# Patient Record
Sex: Male | Born: 1980 | Race: White | Hispanic: No | Marital: Single | Smoking: Current some day smoker
Health system: Southern US, Community
[De-identification: ages and names within clinical notes are randomized; demographics above are authoritative.]

## PROBLEM LIST (undated history)

## (undated) DIAGNOSIS — F32A Depression, unspecified: Secondary | ICD-10-CM

## (undated) DIAGNOSIS — F329 Major depressive disorder, single episode, unspecified: Secondary | ICD-10-CM

## (undated) DIAGNOSIS — K219 Gastro-esophageal reflux disease without esophagitis: Secondary | ICD-10-CM

---
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
## 2012-05-05 ENCOUNTER — Encounter (HOSPITAL_COMMUNITY): Payer: Self-pay

## 2012-05-05 ENCOUNTER — Emergency Department (HOSPITAL_COMMUNITY)
Admission: EM | Admit: 2012-05-05 | Discharge: 2012-05-05 | Disposition: A | Discharge status: Home / Self Care | Payer: BC Managed Care – PPO | Attending: Emergency Medicine | Admitting: Emergency Medicine

## 2012-05-05 DIAGNOSIS — R000 Tachycardia, unspecified: Secondary | ICD-10-CM | POA: Insufficient documentation

## 2012-05-05 DIAGNOSIS — L02519 Cutaneous abscess of unspecified hand: Secondary | ICD-10-CM | POA: Insufficient documentation

## 2012-05-05 DIAGNOSIS — L03114 Cellulitis of left upper limb: Secondary | ICD-10-CM

## 2012-05-05 DIAGNOSIS — L02219 Cutaneous abscess of trunk, unspecified: Secondary | ICD-10-CM | POA: Insufficient documentation

## 2012-05-05 DIAGNOSIS — L03119 Cellulitis of unspecified part of limb: Secondary | ICD-10-CM | POA: Insufficient documentation

## 2012-05-05 LAB — CBC WITH DIFFERENTIAL/PLATELET
Basophils Absolute: 0 10*3/uL (ref 0.0–0.1)
Basophils Relative: 0 % (ref 0–1)
Eosinophils Absolute: 0.1 10*3/uL (ref 0.0–0.7)
Eosinophils Relative: 1 % (ref 0–5)
HCT: 46.7 % (ref 39.0–52.0)
Hemoglobin: 16.8 g/dL (ref 13.0–17.0)
Lymphocytes Relative: 10 % — ABNORMAL LOW (ref 12–46)
Lymphs Abs: 1.1 10*3/uL (ref 0.7–4.0)
MCH: 31.2 pg (ref 26.0–34.0)
MCHC: 36 g/dL (ref 30.0–36.0)
MCV: 86.8 fL (ref 78.0–100.0)
Monocytes Absolute: 0.8 10*3/uL (ref 0.1–1.0)
Monocytes Relative: 7 % (ref 3–12)
Neutro Abs: 9.9 10*3/uL — ABNORMAL HIGH (ref 1.7–7.7)
Neutrophils Relative %: 83 % — ABNORMAL HIGH (ref 43–77)
Platelets: 142 10*3/uL — ABNORMAL LOW (ref 150–400)
RBC: 5.38 MIL/uL (ref 4.22–5.81)
RDW: 12.2 % (ref 11.5–15.5)
WBC: 11.9 10*3/uL — ABNORMAL HIGH (ref 4.0–10.5)

## 2012-05-05 MED ORDER — ONDANSETRON HCL 4 MG PO TABS
4.0000 mg | ORAL_TABLET | Freq: Once | ORAL | Status: AC
  Administered 2012-05-05: 4 mg via ORAL
  Filled 2012-05-05: qty 1

## 2012-05-05 MED ORDER — DOXYCYCLINE HYCLATE 100 MG PO CAPS: 100.0000 mg | ORAL_CAPSULE | Freq: Two times a day (BID) | ORAL | Status: DC

## 2012-05-05 MED ORDER — HYDROCODONE-ACETAMINOPHEN 5-325 MG PO TABS: ORAL_TABLET | ORAL | Status: DC

## 2012-05-05 MED ORDER — HYDROMORPHONE HCL PF 1 MG/ML IJ SOLN
1.0000 mg | Freq: Once | INTRAMUSCULAR | Status: AC
  Administered 2012-05-05: 1 mg via INTRAVENOUS
  Filled 2012-05-05: qty 1

## 2012-05-05 MED ORDER — VANCOMYCIN HCL IN DEXTROSE 1-5 GM/200ML-% IV SOLN
1000.0000 mg | Freq: Once | INTRAVENOUS | Status: AC
  Administered 2012-05-05: 1000 mg via INTRAVENOUS
  Filled 2012-05-05: qty 200

## 2012-05-05 MED ORDER — AMOXICILLIN 500 MG PO CAPS: 500.0000 mg | ORAL_CAPSULE | Freq: Three times a day (TID) | ORAL | Status: DC

## 2012-05-05 NOTE — ED Notes (Signed)
Pt reports has abscess to left hand, left and r groin x 2 days.  Abscesses to hand and left groin are red and swollen.  Small bump noted to r groin.  Unsure if has had fever.

## 2012-05-05 NOTE — ED Provider Notes (Signed)
History     CSN: [id number]  Arrival date & time 05/05/12  1717   First MD Initiated Contact with Patient 05/05/12 1817      Chief Complaint  Patient presents with  . Abscess    (Consider location/radiation/quality/duration/timing/severity/associated sxs/prior treatment) HPI Comments: Patient is a 32 year old male who presents to the emergency department with an abscess of the left hand, and the left lower abdomen area.  The patient states that he first noted a pimple on his left lower abdomen area, he attempted to squeeze it and it then became worse. 2 days ago he had a pimple that he thought was a bite on his left hand he also attempted to squeeze this and today he noted redness and swelling from the dorsum of the hand to the mid forearm. He states that he has pain and feels tightness when he attempts to make a fist. He also has a throbbing pain in the area at times. He said very little drainage from the bite area on the hand. He's not had any fever or chills. No nausea or vomiting reported. He does complain of increasing pain of the left hand. Patient has not taken any medications for these problems.  Patient is a 32 y.o. male presenting with abscess. The history is provided by the patient and the spouse.  Abscess   History reviewed. No pertinent past medical history.  History reviewed. No pertinent past surgical history.  No family history on file.  History  Substance Use Topics  . Smoking status: Never Smoker   . Smokeless tobacco: Not on file  . Alcohol Use: Yes     Comment: occ      Review of Systems  Constitutional: Negative for activity change.       All ROS Neg except as noted in HPI  HENT: Negative for nosebleeds and neck pain.   Eyes: Negative for photophobia and discharge.  Respiratory: Negative for cough, shortness of breath and wheezing.   Cardiovascular: Negative for chest pain and palpitations.  Gastrointestinal: Negative for abdominal pain and blood in  stool.  Genitourinary: Negative for dysuria, frequency and hematuria.  Musculoskeletal: Negative for back pain and arthralgias.  Skin: Positive for wound.  Neurological: Negative for dizziness, seizures and speech difficulty.  Psychiatric/Behavioral: Negative for hallucinations and confusion.    Allergies  Review of patient's allergies indicates no known allergies.  Home Medications  No current outpatient prescriptions on file.  BP 119/65  Pulse 98  Temp(Src) 98.1 F (36.7 C) (Oral)  Resp 20  Ht 5\' 10"  (1.778 m)  Wt 183 lb (83.008 kg)  BMI 26.26 kg/m2  SpO2 97%  Physical Exam  Nursing note and vitals reviewed. Constitutional: He is oriented to person, place, and time. He appears well-developed and well-nourished.  Non-toxic appearance.  HENT:  Head: Normocephalic.  Right Ear: Tympanic membrane and external ear normal.  Left Ear: Tympanic membrane and external ear normal.  Eyes: EOM and lids are normal. Pupils are equal, round, and reactive to light.  Neck: Normal range of motion. Neck supple. Carotid bruit is not present.  Cardiovascular: Regular rhythm, normal heart sounds, intact distal pulses and normal pulses.  Tachycardia present.   Pulmonary/Chest: Breath sounds normal. No respiratory distress.  Abdominal: Soft. Bowel sounds are normal. There is no tenderness. There is no guarding.  Patient has a draining abscess of the left lower abdomen. It is tender to touch. There is a small area of increased redness around the abscess area. There is  no red streaking appreciated. The area is warm but not hot.  Musculoskeletal: Normal range of motion.  There is a denuded blister of the dorsum of the left thumb. There is increased redness and warmth from the metacarpal phalangeal joint areas to the mid forearm. The area is sore and in some areas painful to touch. There is fair range of motion of the fingers due to pain. The capillary refill is less than 3 seconds. I cannot demonstrate  swollen nodes in the biceps triceps area or the axilla. The biceps triceps area is sore to touch.  Lymphadenopathy:       Head (right side): No submandibular adenopathy present.       Head (left side): No submandibular adenopathy present.    He has no cervical adenopathy.  Neurological: He is alert and oriented to person, place, and time. He has normal strength. No cranial nerve deficit or sensory deficit.  Skin: Skin is warm and dry.  Psychiatric: He has a normal mood and affect. His speech is normal.    ED Course  Procedures (including critical care time)  Labs Reviewed  CBC WITH DIFFERENTIAL - Abnormal; Notable for the following:    WBC 11.9 (*)    Platelets 142 (*)    Neutrophils Relative 83 (*)    Neutro Abs 9.9 (*)    Lymphocytes Relative 10 (*)    All other components within normal limits  CULTURE, ROUTINE-ABSCESS   No results found.   No diagnosis found.    MDM  I have reviewed nursing notes, vital signs, and all appropriate lab and imaging results for this patient. Patient had an abscess of the left lower abdomen that he attempted to rupture it. This got worse. He didn't notice what he thought was a bite on the left hand he attempted to rupture this and this 2.worse. The patient presents to the emergency department with an abscess of the left lower abdomen, and cellulitis of the left hand.  The patient was treated in the emergency department with intravenous vancomycin. A complete blood count was obtained which revealed a slightly elevated white blood cell count of 11,900. There was no documented shift to the left.  The patient tolerated the infusion of the vancomycin without problem. The patient was also given pain medication which helped him with his discomfort.  Plan the patient will return to the emergency department on Wednesday, April 9. He will be placed on doxycycline 2 times daily, Mobic 2 times daily, and Norco every 4 hours as needed for pain. Patient will  use warm tub soaks for the abscess on his abdomen. A culture of the drainage from the abscess on the abdomen has been sent to the lab. Patient will return solar if any changes, problems, or concerns.       Kathie Dike, PA-C 05/05/12 2126

## 2012-05-05 NOTE — ED Provider Notes (Signed)
Medical screening examination/treatment/procedure(s) were performed by non-physician practitioner and as supervising physician I was immediately available for consultation/collaboration.   Jaryd Drew L Yamil Dougher, MD 05/05/12 2315 

## 2012-05-08 ENCOUNTER — Emergency Department (HOSPITAL_COMMUNITY): Payer: BC Managed Care – PPO

## 2012-05-08 ENCOUNTER — Encounter (HOSPITAL_COMMUNITY): Payer: Self-pay

## 2012-05-08 ENCOUNTER — Emergency Department (HOSPITAL_COMMUNITY)
Admission: EM | Admit: 2012-05-08 | Discharge: 2012-05-08 | Disposition: A | Discharge status: Home / Self Care | Payer: BC Managed Care – PPO | Attending: Emergency Medicine | Admitting: Emergency Medicine

## 2012-05-08 DIAGNOSIS — X58XXXA Exposure to other specified factors, initial encounter: Secondary | ICD-10-CM | POA: Insufficient documentation

## 2012-05-08 DIAGNOSIS — R198 Other specified symptoms and signs involving the digestive system and abdomen: Secondary | ICD-10-CM | POA: Insufficient documentation

## 2012-05-08 DIAGNOSIS — Y939 Activity, unspecified: Secondary | ICD-10-CM | POA: Insufficient documentation

## 2012-05-08 DIAGNOSIS — L089 Local infection of the skin and subcutaneous tissue, unspecified: Secondary | ICD-10-CM

## 2012-05-08 DIAGNOSIS — IMO0002 Reserved for concepts with insufficient information to code with codable children: Secondary | ICD-10-CM | POA: Insufficient documentation

## 2012-05-08 DIAGNOSIS — Y929 Unspecified place or not applicable: Secondary | ICD-10-CM | POA: Insufficient documentation

## 2012-05-08 NOTE — ED Notes (Signed)
Pt returns to ED for wound check from initial check on Sunday. Pt's left hand remains swollen, however the original cellulitis in said hand has decreased significantly.  Large open abscess noted to mid thumb on left hand, small amount drainage noted. Drainage is yellow and thick at this time. Pt denies fever, chills and n/v. NAD noted.

## 2012-05-08 NOTE — ED Provider Notes (Signed)
History    This chart was scribed for [name], MD by Marlyne Beards, ED Scribe. The patient was seen in room [facility]. Patient's care was started at 3:55 PM.    CSN: [id number]  Arrival date & time 05/08/12  1500   First MD Initiated Contact with Patient 05/08/12 1555      Chief Complaint  Patient presents with  . Wound Check    (Consider location/radiation/quality/duration/timing/severity/associated sxs/prior treatment) Patient is a 32 y.o. male presenting with wound check. The history is provided by the patient. No language interpreter was used.  Wound Check This is a new problem. The current episode started more than 2 days ago. The problem occurs constantly. The problem has been gradually worsening. Pertinent negatives include no chest pain, no abdominal pain, no headaches and no shortness of breath. The symptoms are aggravated by bending.   Steve Frazier is a 32 y.o. male who presents to the Emergency Department complaining of moderate wound infection onset last Friday. Pt states that he thought he had an ingrown hair on his hand so he squeezed it to try and get the hair out resulting in the site to swell up and have purple discoloration. Pt was seen in the ED Sunday and given antibiotics but states the wound has gotten worse today.  Pt also has an irritated area on his lower left abdomen as well. Pt denies trouble breathing/swallowing, fever, chills, cough, nausea, vomiting, diarrhea, SOB, weakness, and any other associated symptoms. Pt admits to alcohol use.    History reviewed. No pertinent past medical history.  History reviewed. No pertinent past surgical history.  No family history on file.  History  Substance Use Topics  . Smoking status: Never Smoker   . Smokeless tobacco: Not on file  . Alcohol Use: Yes     Comment: occ      Review of Systems  Respiratory: Negative for shortness of breath.   Cardiovascular: Negative for chest pain.  Gastrointestinal:  Negative for abdominal pain.  Neurological: Negative for headaches.    Allergies  Review of patient's allergies indicates no known allergies.  Home Medications   Current Outpatient Rx  Name  Route  Sig  Dispense  Refill  . amoxicillin (AMOXIL) 500 MG capsule   Oral   Take 1 capsule (500 mg total) by mouth 3 (three) times daily.   21 capsule   0   . doxycycline (VIBRAMYCIN) 100 MG capsule   Oral   Take 1 capsule (100 mg total) by mouth 2 (two) times daily.   20 capsule   0   . HYDROcodone-acetaminophen (NORCO/VICODIN) 5-325 MG per tablet      1 or 2 po q4h prn pain   20 tablet   0     BP 134/96  Pulse 119  Temp(Src) 97.7 F (36.5 C) (Oral)  Resp 18  Ht 5\' 10"  (1.778 m)  Wt 183 lb (83.008 kg)  BMI 26.26 kg/m2  SpO2 99%  Physical Exam  Nursing note and vitals reviewed. Constitutional: He is oriented to person, place, and time. He appears well-developed.  HENT:  Head: Normocephalic.  Eyes: Conjunctivae are normal.  Neck: No tracheal deviation present.  Cardiovascular:  No murmur heard. Musculoskeletal: Normal range of motion.  Neurological: He is oriented to person, place, and time.  Skin: Skin is warm.  Pt has swelling, redness, and cellulitis to the left hand, 1st MCP joint. Mild irritation to the LLQ of abdomen.  Psychiatric: He has a normal mood  and affect.    ED Course  Procedures (including critical care time) DIAGNOSTIC STUDIES: Oxygen Saturation is 99% on room air, normal by my interpretation.    COORDINATION OF CARE: 4:06 PM Discussed ED treatment with pt and pt agrees.  5:20 PM Discussed with pt about consulting with a hand doctor for symptoms. 5:46 PM Discussed with pt that Dr. Orlan Leavens will be able to see pt in his office tomorrow.  Labs Reviewed - No data to display Dg Hand Complete Left  05/08/2012  *RADIOLOGY REPORT*  Clinical Data: Redness, swelling, pain  LEFT HAND - COMPLETE 3+ VIEW  Comparison: None.  Findings: Three views of the left  hand submitted.  No acute fracture or subluxation.  There is soft tissue swelling in the region of the first metacarpal.  No periosteal reaction or bony erosion.  IMPRESSION: No acute fracture or subluxation.  Soft tissue swelling in the region of first metacarpal.   Original Report Authenticated By: Natasha Mead, M.D.      No diagnosis found.    MDM  The chart was scribed for me under my direct supervision.  I personally performed the history, physical, and medical decision making and all procedures in the evaluation of this patient.[name], MD 05/08/12 [phone]

## 2012-05-08 NOTE — ED Notes (Signed)
Pt states he was here Sunday for infected wound and received antibiotics. States wound is worse today

## 2012-05-09 ENCOUNTER — Encounter (HOSPITAL_COMMUNITY): Payer: Self-pay | Admitting: *Deleted

## 2012-05-09 ENCOUNTER — Encounter (HOSPITAL_COMMUNITY)
Admission: RE | Disposition: A | Discharge status: Home / Self Care | Payer: Self-pay | Source: Ambulatory Visit | Attending: Orthopedic Surgery

## 2012-05-09 ENCOUNTER — Inpatient Hospital Stay (HOSPITAL_COMMUNITY): Payer: BC Managed Care – PPO | Admitting: *Deleted

## 2012-05-09 ENCOUNTER — Observation Stay (HOSPITAL_COMMUNITY)
Admission: RE | Admit: 2012-05-09 | Discharge: 2012-05-11 | Disposition: A | Discharge status: Home / Self Care | Payer: BC Managed Care – PPO | Source: Ambulatory Visit | Attending: Orthopedic Surgery | Admitting: Orthopedic Surgery

## 2012-05-09 DIAGNOSIS — M65849 Other synovitis and tenosynovitis, unspecified hand: Secondary | ICD-10-CM | POA: Insufficient documentation

## 2012-05-09 DIAGNOSIS — L02519 Cutaneous abscess of unspecified hand: Principal | ICD-10-CM | POA: Insufficient documentation

## 2012-05-09 DIAGNOSIS — L03019 Cellulitis of unspecified finger: Principal | ICD-10-CM | POA: Insufficient documentation

## 2012-05-09 DIAGNOSIS — M65839 Other synovitis and tenosynovitis, unspecified forearm: Secondary | ICD-10-CM | POA: Insufficient documentation

## 2012-05-09 HISTORY — DX: Major depressive disorder, single episode, unspecified: F32.9

## 2012-05-09 HISTORY — PX: I&D EXTREMITY: SHX5045

## 2012-05-09 HISTORY — DX: Gastro-esophageal reflux disease without esophagitis: K21.9

## 2012-05-09 HISTORY — DX: Depression, unspecified: F32.A

## 2012-05-09 HISTORY — PX: TENOSYNOVECTOMY: SHX6110

## 2012-05-09 LAB — CULTURE, ROUTINE-ABSCESS

## 2012-05-09 LAB — SURGICAL PCR SCREEN: MRSA, PCR: NEGATIVE

## 2012-05-09 SURGERY — IRRIGATION AND DEBRIDEMENT EXTREMITY
Anesthesia: General | Laterality: Left | Wound class: Dirty or Infected

## 2012-05-09 MED ORDER — MUPIROCIN 2 % EX OINT
TOPICAL_OINTMENT | CUTANEOUS | Status: AC
  Administered 2012-05-09: 1 via NASAL
  Filled 2012-05-09: qty 22

## 2012-05-09 MED ORDER — VITAMIN C 500 MG PO TABS
1000.0000 mg | ORAL_TABLET | Freq: Every day | ORAL | Status: DC
  Administered 2012-05-09 – 2012-05-11 (×3): 1000 mg via ORAL
  Filled 2012-05-09 (×3): qty 2

## 2012-05-09 MED ORDER — MIDAZOLAM HCL 2 MG/2ML IJ SOLN: 0.5000 mg | Freq: Once | INTRAMUSCULAR | Status: DC | PRN

## 2012-05-09 MED ORDER — HYDROMORPHONE HCL PF 1 MG/ML IJ SOLN
0.5000 mg | INTRAMUSCULAR | Status: DC | PRN
  Administered 2012-05-10: 1 mg via INTRAVENOUS
  Filled 2012-05-09: qty 1

## 2012-05-09 MED ORDER — ZOLPIDEM TARTRATE 5 MG PO TABS: 5.0000 mg | ORAL_TABLET | Freq: Every evening | ORAL | Status: DC | PRN

## 2012-05-09 MED ORDER — DOCUSATE SODIUM 100 MG PO CAPS: 100.0000 mg | ORAL_CAPSULE | Freq: Two times a day (BID) | ORAL | Status: DC

## 2012-05-09 MED ORDER — DEXTROSE 5 % IV SOLN: 500.0000 mg | Freq: Four times a day (QID) | INTRAVENOUS | Status: DC | PRN

## 2012-05-09 MED ORDER — ONDANSETRON HCL 4 MG/2ML IJ SOLN: 4.0000 mg | Freq: Four times a day (QID) | INTRAMUSCULAR | Status: DC | PRN

## 2012-05-09 MED ORDER — PROMETHAZINE HCL 25 MG/ML IJ SOLN: 6.2500 mg | INTRAMUSCULAR | Status: DC | PRN

## 2012-05-09 MED ORDER — OXYCODONE HCL 5 MG/5ML PO SOLN: 5.0000 mg | Freq: Once | ORAL | Status: DC | PRN

## 2012-05-09 MED ORDER — HYDROCODONE-ACETAMINOPHEN 5-325 MG PO TABS: 1.0000 | ORAL_TABLET | ORAL | Status: DC | PRN

## 2012-05-09 MED ORDER — HYDROMORPHONE HCL PF 1 MG/ML IJ SOLN
INTRAMUSCULAR | Status: AC
  Filled 2012-05-09: qty 1

## 2012-05-09 MED ORDER — ADULT MULTIVITAMIN W/MINERALS CH
1.0000 | ORAL_TABLET | Freq: Every day | ORAL | Status: DC
  Administered 2012-05-09 – 2012-05-11 (×3): 1 via ORAL
  Filled 2012-05-09 (×3): qty 1

## 2012-05-09 MED ORDER — LACTATED RINGERS IV SOLN
INTRAVENOUS | Status: DC
  Administered 2012-05-09: 16:00:00 via INTRAVENOUS

## 2012-05-09 MED ORDER — FENTANYL CITRATE 0.05 MG/ML IJ SOLN
INTRAMUSCULAR | Status: DC | PRN
  Administered 2012-05-09: 100 ug via INTRAVENOUS
  Administered 2012-05-09: 50 ug via INTRAVENOUS
  Administered 2012-05-09: 100 ug via INTRAVENOUS

## 2012-05-09 MED ORDER — LIDOCAINE HCL (CARDIAC) 20 MG/ML IV SOLN
INTRAVENOUS | Status: DC | PRN
  Administered 2012-05-09: 25 mg via INTRAVENOUS

## 2012-05-09 MED ORDER — ONDANSETRON HCL 4 MG/2ML IJ SOLN
INTRAMUSCULAR | Status: DC | PRN
  Administered 2012-05-09: 4 mg via INTRAVENOUS

## 2012-05-09 MED ORDER — 0.9 % SODIUM CHLORIDE (POUR BTL) OPTIME
TOPICAL | Status: DC | PRN
  Administered 2012-05-09: 1000 mL

## 2012-05-09 MED ORDER — KCL IN DEXTROSE-NACL 20-5-0.45 MEQ/L-%-% IV SOLN
INTRAVENOUS | Status: DC
  Administered 2012-05-11: 04:00:00 via INTRAVENOUS
  Filled 2012-05-09 (×4): qty 1000

## 2012-05-09 MED ORDER — OXYCODONE-ACETAMINOPHEN 10-325 MG PO TABS: 1.0000 | ORAL_TABLET | ORAL | Status: DC | PRN

## 2012-05-09 MED ORDER — VANCOMYCIN HCL IN DEXTROSE 1-5 GM/200ML-% IV SOLN
INTRAVENOUS | Status: AC
  Administered 2012-05-09: 1000 mg via INTRAVENOUS
  Filled 2012-05-09: qty 200

## 2012-05-09 MED ORDER — SODIUM CHLORIDE 0.9 % IR SOLN
Status: DC | PRN
  Administered 2012-05-09: 3000 mL

## 2012-05-09 MED ORDER — MEPERIDINE HCL 25 MG/ML IJ SOLN: 6.2500 mg | INTRAMUSCULAR | Status: DC | PRN

## 2012-05-09 MED ORDER — OXYCODONE HCL 5 MG PO TABS: 5.0000 mg | ORAL_TABLET | Freq: Once | ORAL | Status: DC | PRN

## 2012-05-09 MED ORDER — VANCOMYCIN HCL IN DEXTROSE 1-5 GM/200ML-% IV SOLN
1000.0000 mg | Freq: Three times a day (TID) | INTRAVENOUS | Status: DC
  Administered 2012-05-09 – 2012-05-11 (×6): 1000 mg via INTRAVENOUS
  Filled 2012-05-09 (×8): qty 200

## 2012-05-09 MED ORDER — METHOCARBAMOL 500 MG PO TABS: 500.0000 mg | ORAL_TABLET | Freq: Four times a day (QID) | ORAL | Status: DC | PRN

## 2012-05-09 MED ORDER — ONDANSETRON HCL 4 MG PO TABS: 4.0000 mg | ORAL_TABLET | Freq: Four times a day (QID) | ORAL | Status: DC | PRN

## 2012-05-09 MED ORDER — PROPOFOL 10 MG/ML IV BOLUS
INTRAVENOUS | Status: DC | PRN
  Administered 2012-05-09: 300 mg via INTRAVENOUS

## 2012-05-09 MED ORDER — OXYCODONE-ACETAMINOPHEN 5-325 MG PO TABS
1.0000 | ORAL_TABLET | ORAL | Status: DC | PRN
  Administered 2012-05-09 – 2012-05-10 (×4): 2 via ORAL
  Filled 2012-05-09 (×4): qty 2

## 2012-05-09 MED ORDER — DOCUSATE SODIUM 100 MG PO CAPS
100.0000 mg | ORAL_CAPSULE | Freq: Two times a day (BID) | ORAL | Status: DC
  Administered 2012-05-09 – 2012-05-11 (×4): 100 mg via ORAL
  Filled 2012-05-09 (×5): qty 1

## 2012-05-09 MED ORDER — DIPHENHYDRAMINE HCL 25 MG PO CAPS: 25.0000 mg | ORAL_CAPSULE | Freq: Four times a day (QID) | ORAL | Status: DC | PRN

## 2012-05-09 MED ORDER — LACTATED RINGERS IV SOLN
INTRAVENOUS | Status: DC | PRN
  Administered 2012-05-09: 16:00:00 via INTRAVENOUS

## 2012-05-09 MED ORDER — HYDROMORPHONE HCL PF 1 MG/ML IJ SOLN
0.2500 mg | INTRAMUSCULAR | Status: DC | PRN
  Administered 2012-05-09 (×4): 0.5 mg via INTRAVENOUS

## 2012-05-09 MED ORDER — MIDAZOLAM HCL 5 MG/5ML IJ SOLN
INTRAMUSCULAR | Status: DC | PRN
  Administered 2012-05-09: 2 mg via INTRAVENOUS

## 2012-05-09 SURGICAL SUPPLY — 50 items
BANDAGE CONFORM 2  STR LF (GAUZE/BANDAGES/DRESSINGS) IMPLANT
BANDAGE ELASTIC 3 VELCRO ST LF (GAUZE/BANDAGES/DRESSINGS) ×2 IMPLANT
BANDAGE ELASTIC 4 VELCRO ST LF (GAUZE/BANDAGES/DRESSINGS) ×2 IMPLANT
BANDAGE GAUZE ELAST BULKY 4 IN (GAUZE/BANDAGES/DRESSINGS) ×2 IMPLANT
CLOTH BEACON ORANGE TIMEOUT ST (SAFETY) ×2 IMPLANT
CORDS BIPOLAR (ELECTRODE) ×2 IMPLANT
COVER SURGICAL LIGHT HANDLE (MISCELLANEOUS) ×2 IMPLANT
CUFF TOURNIQUET SINGLE 18IN (TOURNIQUET CUFF) ×2 IMPLANT
CUFF TOURNIQUET SINGLE 24IN (TOURNIQUET CUFF) IMPLANT
CUFF TOURNIQUET SINGLE 34IN LL (TOURNIQUET CUFF) IMPLANT
CUFF TOURNIQUET SINGLE 44IN (TOURNIQUET CUFF) IMPLANT
DRAIN PENROSE 1/4X12 LTX STRL (WOUND CARE) ×2 IMPLANT
DRSG ADAPTIC 3X8 NADH LF (GAUZE/BANDAGES/DRESSINGS) ×2 IMPLANT
DRSG EMULSION OIL 3X3 NADH (GAUZE/BANDAGES/DRESSINGS) ×2 IMPLANT
ELECT REM PT RETURN 9FT ADLT (ELECTROSURGICAL)
ELECTRODE REM PT RTRN 9FT ADLT (ELECTROSURGICAL) IMPLANT
GAUZE XEROFORM 1X8 LF (GAUZE/BANDAGES/DRESSINGS) IMPLANT
GLOVE BIOGEL M STRL SZ7.5 (GLOVE) ×2 IMPLANT
GLOVE SS BIOGEL STRL SZ 8 (GLOVE) ×1 IMPLANT
GLOVE SUPERSENSE BIOGEL SZ 8 (GLOVE) ×1
GOWN PREVENTION PLUS XLARGE (GOWN DISPOSABLE) ×2 IMPLANT
GOWN STRL NON-REIN LRG LVL3 (GOWN DISPOSABLE) ×6 IMPLANT
GOWN STRL REIN XL XLG (GOWN DISPOSABLE) ×4 IMPLANT
HANDPIECE INTERPULSE COAX TIP (DISPOSABLE)
IV NS IRRIG 3000ML ARTHROMATIC (IV SOLUTION) ×2 IMPLANT
KIT BASIN OR (CUSTOM PROCEDURE TRAY) ×2 IMPLANT
KIT ROOM TURNOVER OR (KITS) ×2 IMPLANT
MANIFOLD NEPTUNE II (INSTRUMENTS) ×2 IMPLANT
NEEDLE HYPO 25GX1X1/2 BEV (NEEDLE) IMPLANT
NS IRRIG 1000ML POUR BTL (IV SOLUTION) ×2 IMPLANT
PACK ORTHO EXTREMITY (CUSTOM PROCEDURE TRAY) ×2 IMPLANT
PAD ARMBOARD 7.5X6 YLW CONV (MISCELLANEOUS) ×4 IMPLANT
PAD CAST 4YDX4 CTTN HI CHSV (CAST SUPPLIES) ×1 IMPLANT
PADDING CAST COTTON 4X4 STRL (CAST SUPPLIES) ×1
PADDING CAST SYNTHETIC 4 (CAST SUPPLIES) ×1
PADDING CAST SYNTHETIC 4X4 STR (CAST SUPPLIES) ×1 IMPLANT
SET HNDPC FAN SPRY TIP SCT (DISPOSABLE) IMPLANT
SPLINT FIBERGLASS 3X35 (CAST SUPPLIES) ×2 IMPLANT
SPONGE GAUZE 4X4 12PLY (GAUZE/BANDAGES/DRESSINGS) ×2 IMPLANT
SPONGE LAP 18X18 X RAY DECT (DISPOSABLE) ×2 IMPLANT
SPONGE LAP 4X18 X RAY DECT (DISPOSABLE) ×2 IMPLANT
SUT ETHILON 4 0 PS 2 18 (SUTURE) ×2 IMPLANT
SWAB CULTURE LIQUID MINI MALE (MISCELLANEOUS) ×2 IMPLANT
SYR CONTROL 10ML LL (SYRINGE) IMPLANT
TOWEL OR 17X24 6PK STRL BLUE (TOWEL DISPOSABLE) ×2 IMPLANT
TOWEL OR 17X26 10 PK STRL BLUE (TOWEL DISPOSABLE) ×2 IMPLANT
TUBE ANAEROBIC SPECIMEN COL (MISCELLANEOUS) ×2 IMPLANT
TUBE CONNECTING 12X1/4 (SUCTIONS) ×2 IMPLANT
WATER STERILE IRR 1000ML POUR (IV SOLUTION) ×2 IMPLANT
YANKAUER SUCT BULB TIP NO VENT (SUCTIONS) IMPLANT

## 2012-05-09 NOTE — Anesthesia Procedure Notes (Signed)
Procedure Name: LMA Insertion Date/Time: 05/09/2012 3:57 PM Performed by: Margaree Mackintosh Pre-anesthesia Checklist: Patient identified, Emergency Drugs available, Suction available and Patient being monitored Patient Re-evaluated:Patient Re-evaluated prior to inductionOxygen Delivery Method: Circle system utilized Preoxygenation: Pre-oxygenation with 100% oxygen Intubation Type: IV induction LMA: LMA inserted LMA Size: 5.0 Number of attempts: 1 Placement Confirmation: ETT inserted through vocal cords under direct vision,  positive ETCO2 and breath sounds checked- equal and bilateral Tube secured with: Tape Dental Injury: Teeth and Oropharynx as per pre-operative assessment

## 2012-05-09 NOTE — Transfer of Care (Signed)
Immediate Anesthesia Transfer of Care Note  Patient: Steve Frazier  Procedure(s) Performed: Procedure(s): INCISION & DRAINAGE, DEBRIDEMENT THUMB/HAND (Left)  Patient Location: PACU  Anesthesia Type:General  Level of Consciousness: awake, alert  and oriented  Airway & Oxygen Therapy: Patient Spontanous Breathing and Patient connected to nasal cannula oxygen  Post-op Assessment: Report given to PACU RN and Post -op Vital signs reviewed and stable  Post vital signs: Reviewed and stable  Complications: No apparent anesthesia complications

## 2012-05-09 NOTE — Anesthesia Postprocedure Evaluation (Signed)
  Anesthesia Post-op Note  Patient: Steve Frazier  Procedure(s) Performed: Procedure(s): INCISION & DRAINAGE, DEBRIDEMENT THUMB/HAND (Left)  Patient Location: PACU  Anesthesia Type:General  Level of Consciousness: awake  Airway and Oxygen Therapy: Patient Spontanous Breathing  Post-op Pain: mild  Post-op Assessment: Post-op Vital signs reviewed  Post-op Vital Signs: Reviewed  Complications: No apparent anesthesia complications

## 2012-05-09 NOTE — Anesthesia Preprocedure Evaluation (Signed)
Anesthesia Evaluation  Patient identified by MRN, date of birth, ID band Patient awake    Reviewed: Allergy & Precautions, H&P , NPO status , Patient's Chart, lab work & pertinent test results  History of Anesthesia Complications Negative for: history of anesthetic complications  Airway Mallampati: I TM Distance: >3 FB Neck ROM: Full    Dental  (+) Poor Dentition and Dental Advisory Given   Pulmonary neg pulmonary ROS,  breath sounds clear to auscultation  Pulmonary exam normal       Cardiovascular negative cardio ROS  Rhythm:Regular Rate:Normal     Neuro/Psych negative neurological ROS  negative psych ROS   GI/Hepatic negative GI ROS, Neg liver ROS,   Endo/Other  negative endocrine ROS  Renal/GU negative Renal ROS     Musculoskeletal   Abdominal   Peds  Hematology   Anesthesia Other Findings   Reproductive/Obstetrics                           Anesthesia Physical Anesthesia Plan  ASA: I  Anesthesia Plan: General   Post-op Pain Management:    Induction: Intravenous  Airway Management Planned: LMA  Additional Equipment:   Intra-op Plan:   Post-operative Plan:   Informed Consent: I have reviewed the patients History and Physical, chart, labs and discussed the procedure including the risks, benefits and alternatives for the proposed anesthesia with the patient or authorized representative who has indicated his/her understanding and acceptance.   Dental advisory given  Plan Discussed with: CRNA and Surgeon  Anesthesia Plan Comments: (Plan routine monitors, GA- LMA OK)        Anesthesia Quick Evaluation

## 2012-05-09 NOTE — Progress Notes (Signed)
ANTIBIOTIC CONSULT NOTE - INITIAL  Pharmacy Consult for vancomycin Indication: hand cellulitis s/p I&D on 4/10.  Also has abdominal wound with MSSA.  No Known Allergies  Patient Measurements:   Adjusted Body Weight: 83 kg  Vital Signs: Temp: 98.2 F (36.8 C) (04/10 1854) Temp src: Oral (04/10 1340) BP: 139/67 mmHg (04/10 1854) Pulse Rate: 72 (04/10 1854) Intake/Output from previous day:   Intake/Output from this shift:    Labs: No results found for this basename: WBC, HGB, PLT, LABCREA, CREATININE,  in the last 72 hours CrCl is unknown because no creatinine reading has been taken. No results found for this basename: VANCOTROUGH, VANCOPEAK, VANCORANDOM, GENTTROUGH, GENTPEAK, GENTRANDOM, TOBRATROUGH, TOBRAPEAK, TOBRARND, AMIKACINPEAK, AMIKACINTROU, AMIKACIN,  in the last 72 hours   Microbiology: Recent Results (from the past 720 hour(s))  CULTURE, ROUTINE-ABSCESS     Status: None   Collection Time    05/05/12  9:10 PM      Result Value Range Status   Specimen Description ABSCESS LEFT LOWER ABDOMEN   Final   Special Requests NONE   Final   Gram Stain     Final   Value: MODERATE WBC PRESENT,BOTH PMN AND MONONUCLEAR     NO SQUAMOUS EPITHELIAL CELLS SEEN     ABUNDANT GRAM POSITIVE COCCI     IN PAIRS IN CLUSTERS   Culture     Final   Value: MODERATE STAPHYLOCOCCUS AUREUS     Note: RIFAMPIN AND GENTAMICIN SHOULD NOT BE USED AS SINGLE DRUGS FOR TREATMENT OF STAPH INFECTIONS.   Report Status 05/09/2012 FINAL   Final   Organism ID, Bacteria STAPHYLOCOCCUS AUREUS   Final  SURGICAL PCR SCREEN     Status: None   Collection Time    05/09/12  1:40 PM      Result Value Range Status   MRSA, PCR NEGATIVE  NEGATIVE Final   Staphylococcus aureus NEGATIVE  NEGATIVE Final   Comment:            The Xpert SA Assay (FDA     approved for NASAL specimens     in patients over 11 years of age),     is one component of     a comprehensive surveillance     program.  Test performance has   been validated by [employer] for patients greater     than or equal to 60 year old.     It is not intended     to diagnose infection nor to     guide or monitor treatment.    Medical History: Past Medical History  Diagnosis Date  . Depression   . GERD (gastroesophageal reflux disease)     with spicy food    Medications:  Prescriptions prior to admission  Medication Sig Dispense Refill  . amoxicillin (AMOXIL) 500 MG capsule Take 1 capsule (500 mg total) by mouth 3 (three) times daily.  21 capsule  0  . doxycycline (VIBRAMYCIN) 100 MG capsule Take 1 capsule (100 mg total) by mouth 2 (two) times daily.  20 capsule  0  . HYDROcodone-acetaminophen (NORCO/VICODIN) 5-325 MG per tablet Take 1 tablet by mouth 2 (two) times daily as needed for pain.        Assessment: 32 year old man seen in the ED at [facility] on 4/6 for left hand and abdominal abscess.  Wound culture from the abdominal abscess has grown MSSA.  The patient was taken to the OR today for hand debridement.  Goal of Therapy:  Vancomycin trough level 15-20 mcg/ml  Plan:   Vancomycin 1g IV q8h   Follow further culture results and renal function - check BMET in AM   Mickeal Skinner 05/09/2012,7:14 PM

## 2012-05-09 NOTE — Brief Op Note (Signed)
05/09/2012  3:37 PM  PATIENT:  Steve Frazier  32 y.o. male  PRE-OPERATIVE DIAGNOSIS:  infection  POST-OPERATIVE DIAGNOSIS:  Left thumb infection  PROCEDURE:  Procedure(s): INCISION & DRAINAGE, DEBRIDEMENT THUMB/HAND (Left) AND TENOSYNOVECTOMY  SURGEON:  Surgeon(s) and Role:    * Sharma Covert, MD - Primary  PHYSICIAN ASSISTANT: none  ASSISTANTS: none none  ANESTHESIA:   general  EBL:   minimal  BLOOD ADMINISTERED:none  DRAINS: Penrose drain in the left hand   LOCAL MEDICATIONS USED:  NONE  SPECIMEN:  No Specimen  DISPOSITION OF SPECIMEN:  N/A  COUNTS:  YES  TOURNIQUET:  * No tourniquets in log *  DICTATION: .161096  PLAN OF CARE: Admit for overnight observation  PATIENT DISPOSITION:  PACU - hemodynamically stable.   Delay start of Pharmacological VTE agent (>24hrs) due to surgical blood loss or risk of bleeding: not applicable

## 2012-05-09 NOTE — Preoperative (Signed)
Beta Blockers   Reason not to administer Beta Blockers:Not Applicable 

## 2012-05-09 NOTE — H&P (Signed)
Steve Frazier is an 32 y.o. male.   Chief Complaint: Left thumb abscess HPI: Left thumb infection Seen at [facility] yesterday Sent to my office today Draining purulence from hand Pt here for surgery today  Past Medical History  Diagnosis Date  . Depression   . GERD (gastroesophageal reflux disease)     with spicy food    History reviewed. No pertinent past surgical history.  History reviewed. No pertinent family history. Social History:  reports that he has never smoked. He does not have any smokeless tobacco history on file. He reports that  drinks alcohol. He reports that he does not use illicit drugs.  Allergies: No Known Allergies  Medications Prior to Admission  Medication Sig Dispense Refill  . amoxicillin (AMOXIL) 500 MG capsule Take 1 capsule (500 mg total) by mouth 3 (three) times daily.  21 capsule  0  . doxycycline (VIBRAMYCIN) 100 MG capsule Take 1 capsule (100 mg total) by mouth 2 (two) times daily.  20 capsule  0  . HYDROcodone-acetaminophen (NORCO/VICODIN) 5-325 MG per tablet Take 1 tablet by mouth 2 (two) times daily as needed for pain.         No results found for this or any previous visit (from the past 48 hour(s)). Dg Hand Complete Left  05/08/2012  *RADIOLOGY REPORT*  Clinical Data: Redness, swelling, pain  LEFT HAND - COMPLETE 3+ VIEW  Comparison: None.  Findings: Three views of the left hand submitted.  No acute fracture or subluxation.  There is soft tissue swelling in the region of the first metacarpal.  No periosteal reaction or bony erosion.  IMPRESSION: No acute fracture or subluxation.  Soft tissue swelling in the region of first metacarpal.   Original Report Authenticated By: Natasha Mead, M.D.     NO recent illnesses or hospitalizations  Blood pressure 187/0, pulse 93, temperature 97.1 F (36.2 C), temperature source Oral, resp. rate 18, SpO2 98.00%. General Appearance:  Alert, cooperative, no distress, appears stated age  Head:  Normocephalic, without  obvious abnormality, atraumatic  Eyes:  Pupils equal, conjunctiva/corneas clear,         Throat: Lips, mucosa, and tongue normal; teeth and gums normal  Neck: No visible masses     Lungs:   respirations unlabored  Chest Wall:  No tenderness or deformity  Heart:  Regular rate and rhythm,  Abdomen:   Soft, non-tender,         Extremities: Left thumb draining wound over dorsum of thumb metacarpal. Moderately swollen Thumb warm well perfused Good Cap refil Good blood flow distally  Pulses: 2+ and symmetric  Skin: Skin color, texture, turgor normal, no rashes or lesions     Neurologic: Normal    Assessment/Plan Left thumb abscess with draining wound  Left thumb debridment and drainage  R/B/A DISCUSSED WITH PT IN OFFICE.  PT VOICED UNDERSTANDING OF PLAN CONSENT SIGNED DAY OF SURGERY PT SEEN AND EXAMINED PRIOR TO OPERATIVE PROCEDURE/DAY OF SURGERY SITE MARKED. QUESTIONS ANSWERED WILL remain an inpatient FOLLOWING SURGERY  Sharma Covert 05/09/2012, 3:35 PM

## 2012-05-10 ENCOUNTER — Encounter (HOSPITAL_COMMUNITY): Payer: Self-pay | Admitting: Orthopedic Surgery

## 2012-05-10 LAB — BASIC METABOLIC PANEL
BUN: 11 mg/dL (ref 6–23)
Chloride: 102 mEq/L (ref 96–112)
GFR calc Af Amer: >90 mL/min (ref >90)
Potassium: 4.2 mEq/L (ref 3.5–5.1)

## 2012-05-10 NOTE — Progress Notes (Signed)
Nutrition Brief Note  Patient identified on the Malnutrition Screening Tool (MST) Report  Body mass index is 26.26 kg/(m^2). Patient meets criteria for overweight based on current BMI.   Current diet order is Regular, patient is consuming approximately 80% of meals at this time. Labs and medications reviewed.   Pt reports recent wt loss due to change in exercise regimen. Pt reports appetite has returned and is currently eating well. Does report increased stress r/t recent separation.  No nutrition interventions warranted at this time. If nutrition issues arise, please consult RD.   Loyce Dys, MS RD LDN Clinical Inpatient Dietitian Pager: [phone] Weekend/After hours pager: [phone]

## 2012-05-10 NOTE — Progress Notes (Signed)
Pt doing ok Will look at wound in am Likely will be able to go home after dressing change if wound looks ok Continue with iv abx Spoke with patient today

## 2012-05-10 NOTE — Op Note (Signed)
NAME:  Steve Frazier, Steve Frazier NO.:  [id number]  MEDICAL RECORD NO.:  [id number]  LOCATION:  5N07C                        FACILITY:  [facility]  PHYSICIAN:  Madelynn Done, MD  DATE OF BIRTH:  [birth year]  DATE OF PROCEDURE:  05/09/2012 DATE OF DISCHARGE:                              OPERATIVE REPORT   PREOPERATIVE DIAGNOSIS:  Right thumb deep space infection.  POSTOPERATIVE DIAGNOSIS:  Right thumb deep space infection.  ATTENDING PHYSICIAN:  Madelynn Done, MD, who scrubbed and present for the entire procedure.  ASSISTANT SURGEON:  None.  ANESTHESIA:  General via LMA.  PROCEDURE: 1. Right thumb tenosynovectomy extensor pollicis brevis. 2. Right thumb tenosynovectomy, extensor pollicis longus first and     third dorsal compartments. 3. Right thumb incision and drainage.  SURGICAL INDICATIONS:  Steve Frazier is a right-hand-dominant gentleman, who presented to the office with worsening infection.  The patient was seen and evaluated in the office and given his infection, it was recommended that he undergo the above procedure.  Risks, benefits, and alternatives were discussed in detail with the patient and signed informed consent was obtained.  Risks include, but not limited to bleeding, infection, damage to nearby nerves, arteries, or tendons, loss motion of wrist and digits, incomplete relief of symptoms, and need for further surgical intervention.  ANESTHESIA:  General via LMA.  TOURNIQUET TIME:  Less than 10 minutes at 250 mmHg.  DESCRIPTION OF PROCEDURE:  The patient was properly identified in the preop holding area and marked with a permanent marker made on the left thumb to indicate the correct operative site.  The patient was then brought back to the operating room and placed supine on the anesthesia room table, where general anesthesia was administered.  The patient received preoperative vancomycin.  A well-padded tourniquet was then placed on left  brachium sealed with 1000 drape.  The left upper extremity was then prepped and draped in normal sterile fashion.  Time- out was called, correct side was identified, and procedure then begun. Attention then turned to the left thumb where a longitudinal incision was made directly over the abscess area.  Dissection was then carried down through the skin subcutaneous tissue over the gross purulence was encountered.  Deep space abscess was conservatively dissected, sharply excised with a rongeurs and curettes.  Following this, tenosynovectomy was then carried out of the EPB and EPL tendon, and the patient did have abundant proliferative tenosynovium and debris along the course of both tendons and tenosynovectomy was then carried out along the course of the tendons along the dorsum of the hand extending down to the anatomical snuffbox.  After aggressive debridement, the wound was then thoroughly irrigated.  Copious wound irrigation done throughout and as drainage of the abscess area was then completed.  Following this, the wound was then closed over a Penrose drain with simple Prolene suture and simple nylon suture.  Adaptic dressing, sterile compressive bandage was then applied. The patient tolerated the procedure well, returned to recovery room after being placed in a thumb spica splint.  POSTPROCEDURE PLAN:  The patient will be admitted for IV antibiotics and pain control.  Discharge likely in 48 hours after the drains  removed, and continue on IV antibiotics until the drain is removed and then close followup as an outpatient.     Madelynn Done, MD     FWO/MEDQ  D:  05/09/2012  T:  05/10/2012  Job:  [id number]

## 2012-05-11 NOTE — Progress Notes (Signed)
Patient's IV site in right forearm noted to be slightly red, with suspect for possible infiltration.  IV removed and etremity elevated on pillow.  IV restart to right wrist by IV team, whom are recommending PICC placement if patient is to go home on IV Vancomycin, which he has been receiving here in the hospital.  Nursing will continue to monitor.

## 2012-05-11 NOTE — Discharge Summary (Signed)
Physician Discharge Summary  Patient ID: Steve Frazier MRN: [id number] DOB/AGE: [birth year] 32 y.o.  Admit date: 05/09/2012 Discharge date: 05/11/2012  Admission Diagnoses: infection Past Medical History  Diagnosis Date  . Depression   . GERD (gastroesophageal reflux disease)     with spicy food    Discharge Diagnoses:  LEFT THUMB ABSCESS AND TENOSYNOVITIS  Surgeries: Procedure(s): INCISION & DRAINAGE, DEBRIDEMENT THUMB/HAND on 05/09/2012    Consultants:  NONE  Discharged Condition: Improved  Hospital Course: Steve Frazier is an 32 y.o. male who was admitted 05/09/2012 with a chief complaint of No chief complaint on file. , and found to have a diagnosis of infection.  They were brought to the operating room on 05/09/2012 and underwent Procedure(s): INCISION & DRAINAGE, DEBRIDEMENT THUMB/HAND.    They were given perioperative antibiotics: Anti-infectives   Start     Dose/Rate Route Frequency Ordered Stop   05/09/12 2000  vancomycin (VANCOCIN) IVPB 1000 mg/200 mL premix     1,000 mg 200 mL/hr over 60 Minutes Intravenous Every 8 hours 05/09/12 1913     05/09/12 1537  vancomycin (VANCOCIN) 1 GM/200ML IVPB    Comments:  BUNN, ALISHA: cabinet override      05/09/12 1537 05/09/12 1600    .  They were given sequential compression devices, early ambulation, and  AMBULATION for DVT prophylaxis.  Recent vital signs: Patient Vitals for the past 24 hrs:  BP Temp Temp src Pulse Resp SpO2  05/11/12 0621 115/88 mmHg 98.4 F (36.9 C) Oral 76 18 99 %  05/10/12 2157 123/78 mmHg 98 F (36.7 C) Oral 84 19 100 %  05/10/12 1506 122/67 mmHg 98.3 F (36.8 C) - 77 20 100 %  .  Recent laboratory studies: No results found.  Discharge Medications:     Medication List    TAKE these medications       amoxicillin 500 MG capsule  Commonly known as:  AMOXIL  Take 1 capsule (500 mg total) by mouth 3 (three) times daily.     docusate sodium 100 MG capsule  Commonly known as:  COLACE  Take  1 capsule (100 mg total) by mouth 2 (two) times daily.     doxycycline 100 MG capsule  Commonly known as:  VIBRAMYCIN  Take 1 capsule (100 mg total) by mouth 2 (two) times daily.     HYDROcodone-acetaminophen 5-325 MG per tablet  Commonly known as:  NORCO/VICODIN  Take 1 tablet by mouth 2 (two) times daily as needed for pain.     oxyCODONE-acetaminophen 10-325 MG per tablet  Commonly known as:  PERCOCET  Take 1 tablet by mouth every 4 (four) hours as needed for pain (DO NOT TAKE WITH HYDROCODONE).        Diagnostic Studies: Dg Hand Complete Left  05/08/2012  *RADIOLOGY REPORT*  Clinical Data: Redness, swelling, pain  LEFT HAND - COMPLETE 3+ VIEW  Comparison: None.  Findings: Three views of the left hand submitted.  No acute fracture or subluxation.  There is soft tissue swelling in the region of the first metacarpal.  No periosteal reaction or bony erosion.  IMPRESSION: No acute fracture or subluxation.  Soft tissue swelling in the region of first metacarpal.   Original Report Authenticated By: Natasha Mead, M.D.     They benefited maximally from their hospital stay and there were no complications.     Disposition: 01-Home or Self Care      Follow-up Information   Schedule an appointment as soon as possible for a  visit with Sharma Covert, MD.   Contact information:   [address] [place] [address] [place] [address] [phone]      PT SEEN/EXAMINED ON DAY OF DISCHARGE WOUND LOOKED MUCH BETTER WILL ALLOW D/C TO HOME F/U IN OFFICE ON Tuesday KEEP BANDAGE CLEAN AND DRY  Signed: Sharma Covert 05/11/2012, 11:08 AM

## 2012-05-12 LAB — CULTURE, ROUTINE-ABSCESS

## 2012-05-14 LAB — ANAEROBIC CULTURE

## 2020-10-14 ENCOUNTER — Encounter: Payer: Self-pay | Admitting: Radiology

## 2020-12-04 ENCOUNTER — Emergency Department (HOSPITAL_COMMUNITY): Payer: 59

## 2020-12-04 ENCOUNTER — Emergency Department (HOSPITAL_COMMUNITY): Payer: 59 | Admitting: Anesthesiology

## 2020-12-04 ENCOUNTER — Encounter (HOSPITAL_COMMUNITY)
Admission: EM | Disposition: A | Discharge status: Home / Self Care | Payer: Self-pay | Source: Home / Self Care | Attending: General Surgery

## 2020-12-04 ENCOUNTER — Inpatient Hospital Stay (HOSPITAL_COMMUNITY)
Admission: EM | Admit: 2020-12-04 | Discharge: 2020-12-10 | DRG: 326 | Disposition: A | Discharge status: Home / Self Care | Payer: 59 | Attending: General Surgery | Admitting: General Surgery

## 2020-12-04 ENCOUNTER — Other Ambulatory Visit: Payer: Self-pay

## 2020-12-04 ENCOUNTER — Encounter (HOSPITAL_COMMUNITY): Payer: Self-pay

## 2020-12-04 DIAGNOSIS — K659 Peritonitis, unspecified: Secondary | ICD-10-CM | POA: Diagnosis present

## 2020-12-04 DIAGNOSIS — E875 Hyperkalemia: Secondary | ICD-10-CM | POA: Diagnosis present

## 2020-12-04 DIAGNOSIS — K255 Chronic or unspecified gastric ulcer with perforation: Principal | ICD-10-CM | POA: Diagnosis present

## 2020-12-04 DIAGNOSIS — K567 Ileus, unspecified: Secondary | ICD-10-CM

## 2020-12-04 DIAGNOSIS — Z20822 Contact with and (suspected) exposure to covid-19: Secondary | ICD-10-CM | POA: Diagnosis present

## 2020-12-04 DIAGNOSIS — K631 Perforation of intestine (nontraumatic): Secondary | ICD-10-CM

## 2020-12-04 DIAGNOSIS — K275 Chronic or unspecified peptic ulcer, site unspecified, with perforation: Secondary | ICD-10-CM

## 2020-12-04 DIAGNOSIS — F1490 Cocaine use, unspecified, uncomplicated: Secondary | ICD-10-CM | POA: Diagnosis present

## 2020-12-04 DIAGNOSIS — F17210 Nicotine dependence, cigarettes, uncomplicated: Secondary | ICD-10-CM | POA: Diagnosis present

## 2020-12-04 DIAGNOSIS — B9681 Helicobacter pylori [H. pylori] as the cause of diseases classified elsewhere: Secondary | ICD-10-CM | POA: Diagnosis present

## 2020-12-04 DIAGNOSIS — K251 Acute gastric ulcer with perforation: Secondary | ICD-10-CM

## 2020-12-04 HISTORY — PX: GASTRORRHAPHY: SHX6263

## 2020-12-04 HISTORY — PX: LAPAROTOMY: SHX154

## 2020-12-04 LAB — URINALYSIS, ROUTINE W REFLEX MICROSCOPIC
Bacteria, UA: NONE SEEN
Glucose, UA: NEGATIVE mg/dL
Hgb urine dipstick: NEGATIVE
Ketones, ur: 5 mg/dL — AB
Leukocytes,Ua: NEGATIVE
Nitrite: NEGATIVE
Protein, ur: 100 mg/dL — AB
Specific Gravity, Urine: 1.03 (ref 1.005–1.030)
pH: 5 (ref 5.0–8.0)

## 2020-12-04 LAB — COMPREHENSIVE METABOLIC PANEL
ALT: 16 U/L (ref 0–44)
AST: 27 U/L (ref 15–41)
Albumin: 3.6 g/dL (ref 3.5–5.0)
Alkaline Phosphatase: 74 U/L (ref 38–126)
Anion gap: 13 (ref 5–15)
BUN: 12 mg/dL (ref 6–20)
CO2: 24 mmol/L (ref 22–32)
Calcium: 9.3 mg/dL (ref 8.9–10.3)
Chloride: 94 mmol/L — ABNORMAL LOW (ref 98–111)
Creatinine, Ser: 1.57 mg/dL — ABNORMAL HIGH (ref 0.61–1.24)
GFR, Estimated: 57 mL/min — ABNORMAL LOW (ref >60)
Glucose, Bld: 212 mg/dL — ABNORMAL HIGH (ref 70–99)
Potassium: 4.1 mmol/L (ref 3.5–5.1)
Sodium: 131 mmol/L — ABNORMAL LOW (ref 135–145)
Total Bilirubin: 0.6 mg/dL (ref 0.3–1.2)
Total Protein: 7.1 g/dL (ref 6.5–8.1)

## 2020-12-04 LAB — RAPID URINE DRUG SCREEN, HOSP PERFORMED
Amphetamines: POSITIVE — AB
Barbiturates: NOT DETECTED
Benzodiazepines: NOT DETECTED
Cocaine: POSITIVE — AB
Opiates: POSITIVE — AB
Tetrahydrocannabinol: NOT DETECTED

## 2020-12-04 LAB — CBC WITH DIFFERENTIAL/PLATELET
Abs Immature Granulocytes: 0.06 10*3/uL (ref 0.00–0.07)
Basophils Absolute: 0.1 10*3/uL (ref 0.0–0.1)
Basophils Relative: 0 %
Eosinophils Absolute: 0 10*3/uL (ref 0.0–0.5)
Eosinophils Relative: 0 %
HCT: 52.2 % — ABNORMAL HIGH (ref 39.0–52.0)
Hemoglobin: 17.1 g/dL — ABNORMAL HIGH (ref 13.0–17.0)
Immature Granulocytes: 0 %
Lymphocytes Relative: 5 %
Lymphs Abs: 1 10*3/uL (ref 0.7–4.0)
MCH: 27.1 pg (ref 26.0–34.0)
MCHC: 32.8 g/dL (ref 30.0–36.0)
MCV: 82.7 fL (ref 80.0–100.0)
Monocytes Absolute: 0.7 10*3/uL (ref 0.1–1.0)
Monocytes Relative: 4 %
Neutro Abs: 16.2 10*3/uL — ABNORMAL HIGH (ref 1.7–7.7)
Neutrophils Relative %: 91 %
Platelets: 476 10*3/uL — ABNORMAL HIGH (ref 150–400)
RBC: 6.31 MIL/uL — ABNORMAL HIGH (ref 4.22–5.81)
RDW: 12.8 % (ref 11.5–15.5)
WBC: 18.1 10*3/uL — ABNORMAL HIGH (ref 4.0–10.5)
nRBC: 0 % (ref 0.0–0.2)

## 2020-12-04 LAB — LIPASE, BLOOD: Lipase: 27 U/L (ref 11–51)

## 2020-12-04 LAB — RESP PANEL BY RT-PCR (FLU A&B, COVID) ARPGX2
Influenza A by PCR: NEGATIVE
Influenza B by PCR: NEGATIVE
SARS Coronavirus 2 by RT PCR: NEGATIVE

## 2020-12-04 LAB — MRSA NEXT GEN BY PCR, NASAL: MRSA by PCR Next Gen: NOT DETECTED

## 2020-12-04 LAB — ETHANOL: Alcohol, Ethyl (B): <10 mg/dL (ref <10)

## 2020-12-04 SURGERY — LAPAROTOMY, EXPLORATORY
Anesthesia: General | Site: Abdomen

## 2020-12-04 MED ORDER — ACETAMINOPHEN 650 MG RE SUPP
650.0000 mg | Freq: Four times a day (QID) | RECTAL | Status: DC | PRN
  Administered 2020-12-06: 650 mg via RECTAL
  Filled 2020-12-04: qty 1

## 2020-12-04 MED ORDER — PIPERACILLIN-TAZOBACTAM 3.375 G IVPB 30 MIN
3.3750 g | Freq: Once | INTRAVENOUS | Status: AC
  Administered 2020-12-04: 3.375 g via INTRAVENOUS
  Filled 2020-12-04: qty 50

## 2020-12-04 MED ORDER — BUPIVACAINE LIPOSOME 1.3 % IJ SUSP
INTRAMUSCULAR | Status: DC | PRN
  Administered 2020-12-04: 20 mL

## 2020-12-04 MED ORDER — HYDROMORPHONE HCL 1 MG/ML IJ SOLN
1.0000 mg | INTRAMUSCULAR | Status: DC | PRN
  Administered 2020-12-04 – 2020-12-09 (×28): 1 mg via INTRAVENOUS
  Filled 2020-12-04 (×30): qty 1

## 2020-12-04 MED ORDER — SUCCINYLCHOLINE CHLORIDE 200 MG/10ML IV SOSY
PREFILLED_SYRINGE | INTRAVENOUS | Status: DC | PRN
  Administered 2020-12-04: 100 mg via INTRAVENOUS

## 2020-12-04 MED ORDER — ONDANSETRON HCL 4 MG/2ML IJ SOLN
INTRAMUSCULAR | Status: AC
  Filled 2020-12-04: qty 2

## 2020-12-04 MED ORDER — POVIDONE-IODINE 10 % OINT PACKET
TOPICAL_OINTMENT | CUTANEOUS | Status: DC | PRN
  Administered 2020-12-04: 1 via TOPICAL

## 2020-12-04 MED ORDER — SODIUM CHLORIDE 0.9 % IV BOLUS
1000.0000 mL | Freq: Once | INTRAVENOUS | Status: AC
  Administered 2020-12-04: 1000 mL via INTRAVENOUS

## 2020-12-04 MED ORDER — FENTANYL CITRATE (PF) 100 MCG/2ML IJ SOLN
INTRAMUSCULAR | Status: DC | PRN
  Administered 2020-12-04 (×2): 50 ug via INTRAVENOUS
  Administered 2020-12-04: 100 ug via INTRAVENOUS

## 2020-12-04 MED ORDER — ONDANSETRON HCL 4 MG/2ML IJ SOLN
4.0000 mg | Freq: Once | INTRAMUSCULAR | Status: AC
  Administered 2020-12-04: 4 mg via INTRAVENOUS
  Filled 2020-12-04: qty 2

## 2020-12-04 MED ORDER — PROPOFOL 10 MG/ML IV BOLUS
INTRAVENOUS | Status: AC
  Filled 2020-12-04: qty 20

## 2020-12-04 MED ORDER — CHLORHEXIDINE GLUCONATE CLOTH 2 % EX PADS: 6.0000 | MEDICATED_PAD | Freq: Once | CUTANEOUS | Status: DC

## 2020-12-04 MED ORDER — ENOXAPARIN SODIUM 40 MG/0.4ML IJ SOSY
40.0000 mg | PREFILLED_SYRINGE | INTRAMUSCULAR | Status: DC
  Administered 2020-12-05 – 2020-12-10 (×6): 40 mg via SUBCUTANEOUS
  Filled 2020-12-04 (×6): qty 0.4

## 2020-12-04 MED ORDER — MORPHINE SULFATE (PF) 4 MG/ML IV SOLN
4.0000 mg | Freq: Once | INTRAVENOUS | Status: AC
  Administered 2020-12-04: 4 mg via INTRAVENOUS
  Filled 2020-12-04: qty 1

## 2020-12-04 MED ORDER — SUCCINYLCHOLINE CHLORIDE 200 MG/10ML IV SOSY
PREFILLED_SYRINGE | INTRAVENOUS | Status: AC
  Filled 2020-12-04: qty 10

## 2020-12-04 MED ORDER — HYDROMORPHONE HCL 1 MG/ML IJ SOLN
1.0000 mg | Freq: Once | INTRAMUSCULAR | Status: AC
  Administered 2020-12-04: 1 mg via INTRAVENOUS
  Filled 2020-12-04: qty 1

## 2020-12-04 MED ORDER — LACTATED RINGERS IV SOLN: INTRAVENOUS | Status: DC | PRN

## 2020-12-04 MED ORDER — POVIDONE-IODINE 10 % EX OINT
TOPICAL_OINTMENT | CUTANEOUS | Status: AC
  Filled 2020-12-04: qty 1

## 2020-12-04 MED ORDER — ONDANSETRON HCL 4 MG/2ML IJ SOLN
4.0000 mg | Freq: Four times a day (QID) | INTRAMUSCULAR | Status: DC | PRN
  Administered 2020-12-08: 4 mg via INTRAVENOUS
  Filled 2020-12-04: qty 2

## 2020-12-04 MED ORDER — LACTATED RINGERS IV BOLUS
1000.0000 mL | Freq: Once | INTRAVENOUS | Status: AC
  Administered 2020-12-04: 1000 mL via INTRAVENOUS

## 2020-12-04 MED ORDER — BUPIVACAINE LIPOSOME 1.3 % IJ SUSP
INTRAMUSCULAR | Status: AC
  Filled 2020-12-04: qty 20

## 2020-12-04 MED ORDER — PROPOFOL 10 MG/ML IV BOLUS
INTRAVENOUS | Status: DC | PRN
  Administered 2020-12-04: 50 mg via INTRAVENOUS

## 2020-12-04 MED ORDER — PIPERACILLIN-TAZOBACTAM 3.375 G IVPB
3.3750 g | Freq: Three times a day (TID) | INTRAVENOUS | Status: DC
  Administered 2020-12-05: 3.375 g via INTRAVENOUS
  Filled 2020-12-04 (×7): qty 50

## 2020-12-04 MED ORDER — ROCURONIUM BROMIDE 100 MG/10ML IV SOLN
INTRAVENOUS | Status: DC | PRN
  Administered 2020-12-04: 30 mg via INTRAVENOUS

## 2020-12-04 MED ORDER — IOHEXOL 300 MG/ML  SOLN
100.0000 mL | Freq: Once | INTRAMUSCULAR | Status: AC | PRN
  Administered 2020-12-04: 100 mL via INTRAVENOUS

## 2020-12-04 MED ORDER — FENTANYL CITRATE (PF) 100 MCG/2ML IJ SOLN
INTRAMUSCULAR | Status: AC
  Filled 2020-12-04: qty 2

## 2020-12-04 MED ORDER — LIDOCAINE HCL (PF) 2 % IJ SOLN
INTRAMUSCULAR | Status: AC
  Filled 2020-12-04: qty 5

## 2020-12-04 MED ORDER — LORAZEPAM 2 MG/ML IJ SOLN
2.0000 mg | INTRAMUSCULAR | Status: DC | PRN
  Administered 2020-12-05 – 2020-12-07 (×3): 2 mg via INTRAVENOUS
  Filled 2020-12-04 (×3): qty 1

## 2020-12-04 MED ORDER — MIDAZOLAM HCL 2 MG/2ML IJ SOLN
INTRAMUSCULAR | Status: AC
  Filled 2020-12-04: qty 2

## 2020-12-04 MED ORDER — ONDANSETRON 4 MG PO TBDP
4.0000 mg | ORAL_TABLET | Freq: Four times a day (QID) | ORAL | Status: DC | PRN
  Administered 2020-12-09 – 2020-12-10 (×3): 4 mg via ORAL
  Filled 2020-12-04 (×3): qty 1

## 2020-12-04 MED ORDER — PANTOPRAZOLE SODIUM 40 MG IV SOLR
40.0000 mg | Freq: Every day | INTRAVENOUS | Status: DC
  Administered 2020-12-04 – 2020-12-07 (×4): 40 mg via INTRAVENOUS
  Filled 2020-12-04 (×4): qty 40

## 2020-12-04 MED ORDER — LACTATED RINGERS IV SOLN: INTRAVENOUS | Status: DC

## 2020-12-04 MED ORDER — HYDROMORPHONE HCL 1 MG/ML IJ SOLN: 0.2500 mg | INTRAMUSCULAR | Status: DC | PRN

## 2020-12-04 MED ORDER — MIDAZOLAM HCL 5 MG/5ML IJ SOLN
INTRAMUSCULAR | Status: DC | PRN
Start: 2020-12-04 — End: 2020-12-04
  Administered 2020-12-04: 2 mg via INTRAVENOUS

## 2020-12-04 MED ORDER — SODIUM CHLORIDE 0.9 % IR SOLN
Status: DC | PRN
  Administered 2020-12-04 (×4): 1000 mL

## 2020-12-04 MED ORDER — ACETAMINOPHEN 325 MG PO TABS: 650.0000 mg | ORAL_TABLET | Freq: Four times a day (QID) | ORAL | Status: DC | PRN

## 2020-12-04 MED ORDER — NICOTINE 14 MG/24HR TD PT24
14.0000 mg | MEDICATED_PATCH | TRANSDERMAL | Status: DC
  Administered 2020-12-04 – 2020-12-09 (×4): 14 mg via TRANSDERMAL
  Filled 2020-12-04 (×5): qty 1

## 2020-12-04 SURGICAL SUPPLY — 40 items
CHLORAPREP W/TINT 26 (MISCELLANEOUS) ×2 IMPLANT
CLOTH BEACON ORANGE TIMEOUT ST (SAFETY) ×2 IMPLANT
COVER LIGHT HANDLE STERIS (MISCELLANEOUS) ×4 IMPLANT
DRAPE WARM FLUID 44X44 (DRAPES) ×2 IMPLANT
DRSG OPSITE POSTOP 4X10 (GAUZE/BANDAGES/DRESSINGS) ×2 IMPLANT
DRSG TELFA 3X8 NADH (GAUZE/BANDAGES/DRESSINGS) ×2 IMPLANT
ELECT REM PT RETURN 9FT ADLT (ELECTROSURGICAL) ×2
ELECTRODE REM PT RTRN 9FT ADLT (ELECTROSURGICAL) ×1 IMPLANT
EVACUATOR DRAINAGE 10X20 100CC (DRAIN) ×1 IMPLANT
EVACUATOR SILICONE 100CC (DRAIN) ×2
GLOVE SURG POLYISO LF SZ7.5 (GLOVE) ×2 IMPLANT
GLOVE SURG UNDER POLY LF SZ7 (GLOVE) ×4 IMPLANT
GOWN STRL REUS W/TWL LRG LVL3 (GOWN DISPOSABLE) ×6 IMPLANT
HANDLE SUCTION POOLE (INSTRUMENTS) ×1 IMPLANT
INST SET MAJOR GENERAL (KITS) ×2 IMPLANT
KIT TURNOVER KIT A (KITS) ×2 IMPLANT
LIGASURE IMPACT 36 18CM CVD LR (INSTRUMENTS) ×2 IMPLANT
MANIFOLD NEPTUNE II (INSTRUMENTS) ×2 IMPLANT
NEEDLE HYPO 18GX1.5 BLUNT FILL (NEEDLE) ×2 IMPLANT
NEEDLE HYPO 21X1.5 SAFETY (NEEDLE) ×2 IMPLANT
NS IRRIG 1000ML POUR BTL (IV SOLUTION) ×8 IMPLANT
PACK ABDOMINAL MAJOR (CUSTOM PROCEDURE TRAY) ×2 IMPLANT
PAD ARMBOARD 7.5X6 YLW CONV (MISCELLANEOUS) ×2 IMPLANT
PENCIL SMOKE EVACUATOR (MISCELLANEOUS) ×2 IMPLANT
RETRACTOR WND ALEXIS-O 25 LRG (MISCELLANEOUS) ×1 IMPLANT
RTRCTR WOUND ALEXIS O 25CM LRG (MISCELLANEOUS) ×2
SET BASIN LINEN APH (SET/KITS/TRAYS/PACK) ×2 IMPLANT
SPONGE DRAIN TRACH 4X4 STRL 2S (GAUZE/BANDAGES/DRESSINGS) ×2 IMPLANT
SPONGE T-LAP 18X18 ~~LOC~~+RFID (SPONGE) ×4 IMPLANT
STAPLER VISISTAT (STAPLE) ×2 IMPLANT
SUCTION POOLE HANDLE (INSTRUMENTS) ×2
SUT PDS AB 0 CTX 60 (SUTURE) ×4 IMPLANT
SUT SILK 2 0 (SUTURE)
SUT SILK 2-0 18XBRD TIE 12 (SUTURE) IMPLANT
SUT SILK 3 0 (SUTURE) ×2
SUT SILK 3 0 SH CR/8 (SUTURE) ×2 IMPLANT
SUT SILK 3-0 FS1 18XBRD (SUTURE) ×1 IMPLANT
SWAB CULTURE ESWAB REG 1ML (MISCELLANEOUS) ×2 IMPLANT
SYR 20ML LL LF (SYRINGE) ×4 IMPLANT
TRAY FOLEY MTR SLVR 16FR STAT (SET/KITS/TRAYS/PACK) ×2 IMPLANT

## 2020-12-04 NOTE — H&P (Signed)
Steve Frazier is an 40 y.o. male.   Chief Complaint: Pneumoperitoneum HPI: Patient is a 40 year old white male with a 1 week history of worsening epigastric pain who presents to the emergency room today as his pain worsened last night.  A CT scan of the abdomen was performed which revealed a perforated viscus, consistent with perforated peptic ulcer disease.  Patient states he does take Goody powders.  He also did crack cocaine yesterday.  Pain is made worse with movement.  Past Medical History:  Diagnosis Date   Depression    GERD (gastroesophageal reflux disease)    with spicy food    Past Surgical History:  Procedure Laterality Date   I & D EXTREMITY Left 05/09/2012   Procedure: INCISION & DRAINAGE, DEBRIDEMENT THUMB/HAND;  Surgeon: Sharma Covert, MD;  Location: [facility] OR;  Service: Orthopedics;  Laterality: Left;   TENOSYNOVECTOMY Left 05/09/2012   thumb w/I&D (05/09/2012)    No family history on file. Social History:  reports that he has been smoking cigarettes. He has a 2.50 pack-year smoking history. He has never used smokeless tobacco. He reports current alcohol use. He reports that he does not use drugs.  Allergies: No Known Allergies  (Not in a hospital admission)   Results for orders placed or performed during the hospital encounter of 12/04/20 (from the past 48 hour(s))  CBC with Differential     Status: Abnormal   Collection Time: 12/04/20 11:50 AM  Result Value Ref Range   WBC 18.1 (H) 4.0 - 10.5 K/uL   RBC 6.31 (H) 4.22 - 5.81 MIL/uL   Hemoglobin 17.1 (H) 13.0 - 17.0 g/dL   HCT 02.5 (H) 42.7 - 06.2 %   MCV 82.7 80.0 - 100.0 fL   MCH 27.1 26.0 - 34.0 pg   MCHC 32.8 30.0 - 36.0 g/dL   RDW 37.6 28.3 - 15.1 %   Platelets 476 (H) 150 - 400 K/uL   nRBC 0.0 0.0 - 0.2 %   Neutrophils Relative % 91 %   Neutro Abs 16.2 (H) 1.7 - 7.7 K/uL   Lymphocytes Relative 5 %   Lymphs Abs 1.0 0.7 - 4.0 K/uL   Monocytes Relative 4 %   Monocytes Absolute 0.7 0.1 - 1.0 K/uL    Eosinophils Relative 0 %   Eosinophils Absolute 0.0 0.0 - 0.5 K/uL   Basophils Relative 0 %   Basophils Absolute 0.1 0.0 - 0.1 K/uL   Immature Granulocytes 0 %   Abs Immature Granulocytes 0.06 0.00 - 0.07 K/uL    Comment: Performed at [facility], [address] [place], [place] [address]  Comprehensive metabolic panel     Status: Abnormal   Collection Time: 12/04/20 11:50 AM  Result Value Ref Range   Sodium 131 (L) 135 - 145 mmol/L   Potassium 4.1 3.5 - 5.1 mmol/L   Chloride 94 (L) 98 - 111 mmol/L   CO2 24 22 - 32 mmol/L   Glucose, Bld 212 (H) 70 - 99 mg/dL    Comment: Glucose reference range applies only to samples taken after fasting for at least 8 hours.   BUN 12 6 - 20 mg/dL   Creatinine, Ser 7.37 (H) 0.61 - 1.24 mg/dL   Calcium 9.3 8.9 - 10.6 mg/dL   Total Protein 7.1 6.5 - 8.1 g/dL   Albumin 3.6 3.5 - 5.0 g/dL   AST 27 15 - 41 U/L   ALT 16 0 - 44 U/L   Alkaline Phosphatase 74 38 - 126  U/L   Total Bilirubin 0.6 0.3 - 1.2 mg/dL   GFR, Estimated 57 (L) >60 mL/min    Comment: (NOTE) Calculated using the CKD-EPI Creatinine Equation (2021)    Anion gap 13 5 - 15    Comment: Performed at [facility], [address] [place], [place] [address]  Lipase, blood     Status: None   Collection Time: 12/04/20 11:50 AM  Result Value Ref Range   Lipase 27 11 - 51 U/L    Comment: Performed at [facility], [address], [place], [place] [address]  Ethanol     Status: None   Collection Time: 12/04/20 11:50 AM  Result Value Ref Range   Alcohol, Ethyl (B) <10 <10 mg/dL    Comment: (NOTE) Lowest detectable limit for serum alcohol is 10 mg/dL.  For medical purposes only. Performed at [facility], [address] [place], [place] [address]    CT ABDOMEN PELVIS W CONTRAST  Result Date: 12/04/2020 CLINICAL DATA:  Upper abdominal pain for 2 weeks with increased pain last night. EXAM: CT ABDOMEN AND PELVIS WITH CONTRAST TECHNIQUE: Multidetector CT imaging of the abdomen and pelvis was  performed using the standard protocol following bolus administration of intravenous contrast. CONTRAST:  OMNIPAQUE IOHEXOL 300 MG/ML  SOLN COMPARISON:  None. FINDINGS: Lower chest: No acute abnormality. Hepatobiliary: No focal liver abnormality is seen. No gallstones, gallbladder wall thickening, or biliary dilatation. Pancreas: Unremarkable. No pancreatic ductal dilatation or surrounding inflammatory changes. Spleen: Normal in size without focal abnormality. Adrenals/Urinary Tract: Adrenal glands are unremarkable. Kidneys are normal, without renal calculi, focal lesion, or hydronephrosis. Minimally distended urinary bladder. Stomach/Bowel: Diffuse wall thickening of the lower gastric body with marked wall thickening and edema in the gastric antrum. There is a 0.6 cm focal area of mucosal discontinuity along the posterior wall of the gastric antrum with adjacent free intraperitoneal air and fluid, best appreciated on sagittal series (series 6, image 60; series 2, image 26). Multiple dilated loops of small bowel measuring up to 3 cm in diameter. The gas-filled transverse colon is also dilated measuring 6 cm in diameter. Normal appendix in the right lower abdomen. Vascular/Lymphatic: No significant vascular findings are present. No enlarged abdominal or pelvic lymph nodes. Reproductive: Prostatic calcifications. Other: Small amount of free intraperitoneal air, predominantly in the upper abdomen. Moderate volume of abdominopelvic ascites. Musculoskeletal: No acute or significant osseous findings. IMPRESSION: 1. Perforated 0.6 cm ulcer along the posterior wall of the gastric antrum with small amount of free intraperitoneal air and moderate abdominopelvic ascites. 2. Multiple dilated loops of small bowel and dilated transverse colon, suggestive of developing ileus. Critical Value/emergent results were called by telephone at the time of interpretation on 12/04/2020 at 4:07 pm to provider [facility] , who verbally  acknowledged these results. Electronically Signed   By: Sherron Ales M.D.   On: 12/04/2020 16:16   US Abdomen Limited  Result Date: 12/04/2020 CLINICAL DATA:  Abdominal pain EXAM: ULTRASOUND ABDOMEN LIMITED RIGHT UPPER QUADRANT COMPARISON:  None. FINDINGS: Gallbladder: No gallstones or wall thickening visualized. No sonographic Murphy sign noted by sonographer. Common bile duct: Diameter: 4.7 mm Liver: No focal lesion identified. Within normal limits in parenchymal echogenicity. Portal vein is patent on color Doppler imaging with normal direction of blood flow towards the liver. Other: None. IMPRESSION: Negative right upper quadrant ultrasound.  Negative for gallstone. Electronically Signed   By: Marlan Palau M.D.   On: 12/04/2020 13:04    Review of Systems  Constitutional:  Negative.   HENT: Negative.    Eyes: Negative.   Respiratory: Negative.    Cardiovascular: Negative.   Gastrointestinal:  Positive for abdominal pain and nausea.  Endocrine: Negative.   Genitourinary: Negative.   Musculoskeletal: Negative.   Skin: Negative.   Allergic/Immunologic: Negative.   Neurological: Negative.   Hematological: Negative.   Psychiatric/Behavioral: Negative.     Blood pressure 106/67, pulse (!) 133, temperature 97.8 F (36.6 C), temperature source Oral, resp. rate (!) 31, height 5\' 11"  (1.803 m), weight 74.4 kg, SpO2 100 %. Physical Exam Vitals reviewed.  Constitutional:      General: He is in acute distress.     Appearance: He is well-developed and normal weight. He is diaphoretic.  HENT:     Head: Normocephalic and atraumatic.  Cardiovascular:     Rate and Rhythm: Regular rhythm. Tachycardia present.     Heart sounds: Normal heart sounds. No murmur heard.   No friction rub. No gallop.  Pulmonary:     Effort: Pulmonary effort is normal. No respiratory distress.     Breath sounds: Normal breath sounds. No stridor. No wheezing, rhonchi or rales.  Abdominal:     General: Abdomen is flat.  Bowel sounds are decreased.     Palpations: Abdomen is rigid.     Tenderness: There is generalized abdominal tenderness.  Skin:    General: Skin is warm.  Neurological:     Mental Status: He is alert and oriented to person, place, and time.    CT scan reviewed Assessment/Plan Impression: Perforated viscus, peritoneal signs consistent with probable perforated peptic ulcer, cocaine use Plan: We will take patient urgently to the operating room for exploratory laparotomy, probable Graham plication.  The risks and benefits of the procedure including bleeding, infection, and cardiac difficulties were fully explained to the patient, who gave informed consent.  , MD 12/04/2020, 4:27 PM

## 2020-12-04 NOTE — Interval H&P Note (Signed)
History and Physical Interval Note:  12/04/2020 5:10 PM  Steve Frazier  has presented today for surgery, with the diagnosis of perforated viscus.  The various methods of treatment have been discussed with the patient and family. After consideration of risks, benefits and other options for treatment, the patient has consented to  Procedure(s): EXPLORATORY LAPAROTOMY (N/A) as a surgical intervention.  The patient's history has been reviewed, patient examined, no change in status, stable for surgery.  I have reviewed the patient's chart and labs.  Questions were answered to the patient's satisfaction.     Franky Macho

## 2020-12-04 NOTE — Anesthesia Preprocedure Evaluation (Addendum)
Anesthesia Evaluation  Patient identified by MRN, date of birth, ID band Patient awake    Reviewed: Allergy & Precautions, H&P , NPO status , Patient's Chart, lab work & pertinent test results, reviewed documented beta blocker date and time   Airway Mallampati: II  TM Distance: >3 FB Neck ROM: full    Dental no notable dental hx. (+) Teeth Intact   Pulmonary neg pulmonary ROS, Current Smoker,    Pulmonary exam normal breath sounds clear to auscultation       Cardiovascular Exercise Tolerance: Good negative cardio ROS   Rhythm:regular Rate:Normal     Neuro/Psych negative neurological ROS  negative psych ROS   GI/Hepatic PUD, GERD  Medicated,(+)     substance abuse  cocaine use,   Endo/Other  negative endocrine ROS  Renal/GU negative Renal ROS  negative genitourinary   Musculoskeletal   Abdominal   Peds  Hematology negative hematology ROS (+)   Anesthesia Other Findings   Reproductive/Obstetrics negative OB ROS                            Anesthesia Physical Anesthesia Plan  ASA: 4 and emergent  Anesthesia Plan: General and General ETT   Post-op Pain Management:    Induction:   PONV Risk Score and Plan: Ondansetron  Airway Management Planned:   Additional Equipment:   Intra-op Plan:   Post-operative Plan:   Informed Consent: I have reviewed the patients History and Physical, chart, labs and discussed the procedure including the risks, benefits and alternatives for the proposed anesthesia with the patient or authorized representative who has indicated his/her understanding and acceptance.     Dental Advisory Given  Plan Discussed with: CRNA  Anesthesia Plan Comments:         Anesthesia Quick Evaluation

## 2020-12-04 NOTE — Anesthesia Postprocedure Evaluation (Signed)
Anesthesia Post Note  Patient: Steve Frazier  Procedure(s) Performed: EXPLORATORY LAPAROTOMY (Abdomen) GASTRORRHAPHY (Abdomen)  Patient location during evaluation: ICU Anesthesia Type: General Level of consciousness: awake and alert Pain management: pain level controlled Vital Signs Assessment: post-procedure vital signs reviewed and stable Respiratory status: spontaneous breathing, nonlabored ventilation, respiratory function stable and patient connected to nasal cannula oxygen Cardiovascular status: blood pressure returned to baseline and stable Postop Assessment: no apparent nausea or vomiting Anesthetic complications: no   No notable events documented.   Last Vitals:  Vitals:   12/04/20 1500 12/04/20 1626  BP: 106/67 116/75  Pulse: (!) 133 (!) 133  Resp: (!) 31 (!) 42  Temp:    SpO2: 100% 99%    Last Pain:  Vitals:   12/04/20 1707  TempSrc:   PainSc: 9                  Windell Norfolk

## 2020-12-04 NOTE — ED Provider Notes (Signed)
[facility] EMERGENCY DEPARTMENT Provider Note   CSN: [id number] Arrival date & time: 12/04/20  1125     History Chief Complaint  Patient presents with   Abdominal Pain    Steve Frazier is a 40 y.o. male.  The history is provided by the patient. No language interpreter was used.  Abdominal Pain  40 year old male significant history of GERD, depression, who presents via EMS from home for evaluation of abdominal pain.  Patient report having upper abdominal pain for the past week.  Described pain as a sharp stabbing sensation across his upper abdomen, has been waxing waning but became much more intense today.  He states he endorsed nausea, did vomit nonbloody nonbilious contents.  Pain is 10 out of 10, worse when he lays flat with movement.  No complaints of fever or chills no shortness of breath productive cough dysuria back pain or any recent injury.  No specific treatment tried at home.  Does admits to tobacco use but denies alcohol use or marijuana use.  No prior abdominal surgery.  Past Medical History:  Diagnosis Date   Depression    GERD (gastroesophageal reflux disease)    with spicy food    There are no problems to display for this patient.   Past Surgical History:  Procedure Laterality Date   I & D EXTREMITY Left 05/09/2012   Procedure: INCISION & DRAINAGE, DEBRIDEMENT THUMB/HAND;  Surgeon: Sharma Covert, MD;  Location: [facility] OR;  Service: Orthopedics;  Laterality: Left;   TENOSYNOVECTOMY Left 05/09/2012   thumb w/I&D (05/09/2012)       No family history on file.  Social History   Tobacco Use   Smoking status: Some Days    Packs/day: 0.50    Years: 5.00    Pack years: 2.50    Types: Cigarettes   Smokeless tobacco: Never   Tobacco comments:    05/09/2012 "borrow smokes; not more than 1 or 2 cigarettes/mongh"  Substance Use Topics   Alcohol use: Yes    Comment: 05/09/2012 "socially drink alcohol; once/month might have a beer"   Drug use: No    Home  Medications Prior to Admission medications   Medication Sig Start Date End Date Taking? Authorizing Provider  amoxicillin (AMOXIL) 500 MG capsule Take 1 capsule (500 mg total) by mouth 3 (three) times daily. 05/05/12   Ivery Quale, PA-C  docusate sodium (COLACE) 100 MG capsule Take 1 capsule (100 mg total) by mouth 2 (two) times daily. 05/09/12   Bradly Bienenstock, MD  doxycycline (VIBRAMYCIN) 100 MG capsule Take 1 capsule (100 mg total) by mouth 2 (two) times daily. 05/05/12   Ivery Quale, PA-C  HYDROcodone-acetaminophen (NORCO/VICODIN) 5-325 MG per tablet Take 1 tablet by mouth 2 (two) times daily as needed for pain.     [provider]  oxyCODONE-acetaminophen (PERCOCET) 10-325 MG per tablet Take 1 tablet by mouth every 4 (four) hours as needed for pain (DO NOT TAKE WITH HYDROCODONE). 05/09/12   Bradly Bienenstock, MD    Allergies    Patient has no known allergies.  Review of Systems   Review of Systems  Gastrointestinal:  Positive for abdominal pain.  All other systems reviewed and are negative.  Physical Exam Updated Vital Signs BP 103/78 (BP Location: Left Arm)   Pulse 88   Temp 97.8 F (36.6 C) (Oral) Comment: Simultaneous filing. User may not have seen previous data. Comment (Src): Simultaneous filing. User may not have seen previous data.  Resp (!) 25   Ht  5\' 11"  (1.803 m)   Wt 74.4 kg   SpO2 100%   BMI 22.87 kg/m   Physical Exam Vitals and nursing note reviewed.  Constitutional:      Appearance: He is well-developed.     Comments: Appears very uncomfortable, moaning and holding his abdomen  HENT:     Head: Atraumatic.  Eyes:     Conjunctiva/sclera: Conjunctivae normal.  Cardiovascular:     Rate and Rhythm: Normal rate and regular rhythm.  Pulmonary:     Comments: Shallow breathing, tachypneic Abdominal:     General: Bowel sounds are normal.     Palpations: Abdomen is rigid.     Tenderness: There is generalized abdominal tenderness. There is guarding. There is  no rebound.  Musculoskeletal:     Cervical back: Neck supple.  Skin:    Findings: No rash.  Neurological:     Mental Status: He is alert.  Psychiatric:        Mood and Affect: Mood is anxious.    ED Results / Procedures / Treatments   Labs (all labs ordered are listed, but only abnormal results are displayed) Labs Reviewed  CBC WITH DIFFERENTIAL/PLATELET - Abnormal; Notable for the following components:      Result Value   WBC 18.1 (*)    RBC 6.31 (*)    Hemoglobin 17.1 (*)    HCT 52.2 (*)    Platelets 476 (*)    Neutro Abs 16.2 (*)    All other components within normal limits  COMPREHENSIVE METABOLIC PANEL - Abnormal; Notable for the following components:   Sodium 131 (*)    Chloride 94 (*)    Glucose, Bld 212 (*)    Creatinine, Ser 1.57 (*)    GFR, Estimated 57 (*)    All other components within normal limits  RAPID URINE DRUG SCREEN, HOSP PERFORMED - Abnormal; Notable for the following components:   Opiates POSITIVE (*)    Cocaine POSITIVE (*)    Amphetamines POSITIVE (*)    All other components within normal limits  RESP PANEL BY RT-PCR (FLU A&B, COVID) ARPGX2  LIPASE, BLOOD  ETHANOL  URINALYSIS, ROUTINE W REFLEX MICROSCOPIC    EKG None ED ECG REPORT   Date: 12/04/2020  Rate: 135  Rhythm: sinus tachycardia  QRS Axis: normal  Intervals: normal  ST/T Wave abnormalities: early repolarization  Conduction Disutrbances:none  Narrative Interpretation:   Old EKG Reviewed: none available  I have personally reviewed the EKG tracing and agree with the computerized printout as noted.   Radiology 13/05/2020 Abdomen Limited  Result Date: 12/04/2020 CLINICAL DATA:  Abdominal pain EXAM: ULTRASOUND ABDOMEN LIMITED RIGHT UPPER QUADRANT COMPARISON:  None. FINDINGS: Gallbladder: No gallstones or wall thickening visualized. No sonographic Murphy sign noted by sonographer. Common bile duct: Diameter: 4.7 mm Liver: No focal lesion identified. Within normal limits in parenchymal  echogenicity. Portal vein is patent on color Doppler imaging with normal direction of blood flow towards the liver. Other: None. IMPRESSION: Negative right upper quadrant ultrasound.  Negative for gallstone. Electronically Signed   By: 13/05/2020 M.D.   On: 12/04/2020 13:04    Procedures .Critical Care E&M Performed by: 13/05/2020, PA-C  Critical care provider statement:    Critical care time (minutes):  47   Critical care was necessary to treat or prevent imminent or life-threatening deterioration of the following conditions:  Shock   Critical care was time spent personally by me on the following activities:  Blood draw for specimens, development  of treatment plan with patient or surrogate, discussions with consultants, evaluation of patient's response to treatment, examination of patient, obtaining history from patient or surrogate, ordering and performing treatments and interventions, ordering and review of laboratory studies, ordering and review of radiographic studies, re-evaluation of patient's condition and review of old charts   I assumed direction of critical care for this patient from another provider in my specialty: no     Care discussed with: admitting provider   After initial E/M assessment, critical care services were subsequently performed that were exclusive of separately billable procedures or treatment.     Medications Ordered in ED Medications  piperacillin-tazobactam (ZOSYN) IVPB 3.375 g (has no administration in time range)  HYDROmorphone (DILAUDID) injection 1 mg (has no administration in time range)  sodium chloride 0.9 % bolus 1,000 mL (has no administration in time range)  morphine 4 MG/ML injection 4 mg (4 mg Intravenous Given 12/04/20 1207)  ondansetron (ZOFRAN) injection 4 mg (4 mg Intravenous Given 12/04/20 1207)  HYDROmorphone (DILAUDID) injection 1 mg (1 mg Intravenous Given 12/04/20 1349)  sodium chloride 0.9 % bolus 1,000 mL (0 mLs Intravenous Stopped  12/04/20 1512)  iohexol (OMNIPAQUE) 300 MG/ML solution 100 mL (100 mLs Intravenous Contrast Given 12/04/20 1541)    ED Course  I have reviewed the triage vital signs and the nursing notes.  Pertinent labs & imaging results that were available during my care of the patient were reviewed by me and considered in my medical decision making (see chart for details).    MDM Rules/Calculators/A&P                           BP 106/67   Pulse (!) 133   Temp 97.8 F (36.6 C) (Oral) Comment: Simultaneous filing. User may not have seen previous data. Comment (Src): Simultaneous filing. User may not have seen previous data.  Resp (!) 31   Ht 5\' 11"  (1.803 m)   Wt 74.4 kg   SpO2 100%   BMI 22.87 kg/m   Final Clinical Impression(s) / ED Diagnoses Final diagnoses:  Acute ulcer of the stomach and intestines with perforation (HCC)  Ileus (HCC)    Rx / DC Orders ED Discharge Orders     None      11:59 AM Patient here with upper abdominal pain for more than a week but became much more intense last night prompting this ER visit.  Endorse nausea and vomiting as well.  He appears uncomfortable on exam.  Diffuse upper tenderness on initial exam.  Will give GI cocktail, pain medication, and will obtain limited abdominal ultrasound if negative will consider abdominal pelvis CT scan.  1:40 PM Limited abdominal ultrasound without any acute finding.  Initially patient received 4 mg of morphine but report no significant improvement of his symptoms.  At this time he appears more uncomfortable continues to endorse pain.  Additional pain medication given.  Will obtain abdominal pelvic CT scan for further assessment.  4:17 PM Labs remarkable for elevated white count of 18.1, hemoconcentrated blood with a hemoglobin of 17.1, evidence of mild AKI with a creatinine of 1.57, CBG is 212 normal lipase negative alcohol level.  Radiologist called to inform that patient has a perforated stomach ulcer with free air and  developing ileus.  I promptly consult on-call general surgeon Dr. who will see patient and will manage further. Pt is NPO.  Additional pain medication given.  Additional IV fluid given.  UDS positive for opiate, cocaine, and amphetamine.  Pt denies alcohol use or regular NSAIDs use.    5:05 PM General surgeon Dr. Wynona Neat and has seen and evaluated patient.  Plan to take patient to the OR for surgical management.  Patient did admits to taking BC powders which likely contributed to his perforated peptic ulcer.   Fayrene Helper, PA-C 12/04/20 1706    Bethann Berkshire, MD 12/07/20 [phone]

## 2020-12-04 NOTE — Op Note (Signed)
Patient:  Steve Frazier  DOB:  1980-11-30  MRN:  [id number]   Preop Diagnosis: Perforated viscus, peritonitis  Postop Diagnosis: Same, perforated peptic ulcer  Procedure: Exploratory laparotomy, gastrorrhaphy  Surgeon: Franky Macho, MD  Anes: General endotracheal  Indications: Patient is a 40 year old white male who presented to the emergency room with worsening abdominal pain.  He was noted to have a rigid abdomen.  CT scan of the abdomen revealed pneumoperitoneum and free fluid in the abdomen, consistent with a perforated peptic ulcer.  He last did crack cocaine yesterday evening.  He now presents for exploratory laparotomy.  The risks and benefits of the procedure including bleeding, infection, and cardiopulmonary difficulties were fully explained to the patient, who gave informed consent.  Procedure note: The patient was placed in supine position.  After induction of general endotracheal anesthesia, the abdomen was prepped and draped using the usual sterile technique with ChloraPrep.  Surgical site confirmation was performed.  An upper midline incision was made from the subxiphoid process to the umbilicus.  The peritoneal cavity was entered into without difficulty.  Malodorous cloudy fluid was found.  This was the cause of his peritonitis.  An aerobic and anaerobic cultures were taken and sent to microbiology.  Approximately 1-1/2 L of fluid was evacuated.  On exploration of the stomach, the patient had a prepyloric ulcer that was approximately 1 cm in its greatest diameter anteriorly.  A biopsy of the wall of the ulcer was taken and sent to pathology further examination and treatment.  Using the LigaSure, greater omentum was freed away from the transverse colon and the groin plication was performed.  The omentum was patched over the hole using 3-0 silk sutures.  It was secured in the place alongside the anterior portion of the stomach using 3-0 silk sutures.  The abdominal cavity was then  copiously irrigated with normal saline until the effluent was clear.  A #10 flat Jackson-Pratt drain was placed in the epigastric region over the plication and brought through a separate stab wound to the right of the midline incision.  It was secured in place using a 3-0 nylon interrupted suture.  The fascia was reapproximated using a looped 0 PDS running suture.  Exparel was instilled into the surrounding wound.  The skin was closed using staples.  Betadine ointment and dry sterile dressing were applied.  All tape and needle counts were correct at the end of the procedure.  The patient was extubated in the operating room and was transported to the ICU in guarded condition.  Complications: None  EBL: 25 cc  Specimen: Aerobic and anaerobic cultures of intra abdominal fluid                    Gastric ulcer biopsy

## 2020-12-04 NOTE — Anesthesia Procedure Notes (Signed)
Procedure Name: Intubation Date/Time: 12/04/2020 6:14 PM Performed by: Windell Norfolk, MD Pre-anesthesia Checklist: Patient identified, Patient being monitored, Timeout performed, Emergency Drugs available and Suction available Patient Re-evaluated:Patient Re-evaluated prior to induction Oxygen Delivery Method: Circle System Utilized Preoxygenation: Pre-oxygenation with 100% oxygen Induction Type: IV induction Ventilation: Mask ventilation without difficulty Laryngoscope Size: Miller and 2 Grade View: Grade II Tube type: Oral Tube size: 7.0 mm Number of attempts: 1 Airway Equipment and Method: Stylet Placement Confirmation: ETT inserted through vocal cords under direct vision, positive ETCO2 and breath sounds checked- equal and bilateral Secured at: 21 cm Tube secured with: Tape Dental Injury: Teeth and Oropharynx as per pre-operative assessment

## 2020-12-04 NOTE — ED Triage Notes (Addendum)
Pt brought in by [facility], c/o upper abd pain x 2 weeks with increase in pain last night.

## 2020-12-04 NOTE — Transfer of Care (Signed)
Immediate Anesthesia Transfer of Care Note  Patient: Steve Frazier  Procedure(s) Performed: EXPLORATORY LAPAROTOMY (Abdomen) GASTRORRHAPHY (Abdomen)  Patient Location: ICU  Anesthesia Type:General  Level of Consciousness: awake and oriented  Airway & Oxygen Therapy: Patient Spontanous Breathing  Post-op Assessment: Report given to RN  Post vital signs: Reviewed and unstable  Last Vitals:  Vitals Value Taken Time  BP    Temp    Pulse    Resp    SpO2      Last Pain:  Vitals:   12/04/20 1707  TempSrc:   PainSc: 9          Complications: No notable events documented.

## 2020-12-04 NOTE — Plan of Care (Signed)
  Problem: Education: Goal: Knowledge of General Education information will improve Description: Including pain rating scale, medication(s)/side effects and non-pharmacologic comfort measures Outcome: Progressing   Problem: Health Behavior/Discharge Planning: Goal: Ability to manage health-related needs will improve Outcome: Progressing   Problem: Clinical Measurements: Goal: Ability to maintain clinical measurements within normal limits will improve Outcome: Progressing Goal: Will remain free from infection Outcome: Progressing Goal: Diagnostic test results will improve Outcome: Progressing Goal: Respiratory complications will improve Outcome: Progressing Goal: Cardiovascular complication will be avoided Outcome: Progressing   Problem: Activity: Goal: Risk for activity intolerance will decrease Outcome: Progressing   Problem: Nutrition: Goal: Adequate nutrition will be maintained Outcome: Progressing   Problem: Coping: Goal: Level of anxiety will decrease Outcome: Progressing   Problem: Elimination: Goal: Will not experience complications related to bowel motility Outcome: Progressing Goal: Will not experience complications related to urinary retention Outcome: Progressing   Problem: Pain Managment: Goal: General experience of comfort will improve Outcome: Progressing   Problem: Safety: Goal: Ability to remain free from injury will improve Outcome: Progressing   Problem: Skin Integrity: Goal: Risk for impaired skin integrity will decrease Outcome: Progressing   Problem: Activity: Goal: Ability to tolerate increased activity will improve Outcome: Progressing   Problem: Bowel/Gastric: Goal: Gastrointestinal status for postoperative course will improve Outcome: Progressing   Problem: Respiratory: Goal: Respiratory status will improve Outcome: Progressing   Problem: Skin Integrity: Goal: Will show signs of wound healing Outcome: Progressing

## 2020-12-05 LAB — MAGNESIUM: Magnesium: 1.5 mg/dL — ABNORMAL LOW (ref 1.7–2.4)

## 2020-12-05 LAB — BASIC METABOLIC PANEL
Anion gap: 7 (ref 5–15)
BUN: 18 mg/dL (ref 6–20)
CO2: 26 mmol/L (ref 22–32)
Calcium: 8 mg/dL — ABNORMAL LOW (ref 8.9–10.3)
Chloride: 100 mmol/L (ref 98–111)
Creatinine, Ser: 1.55 mg/dL — ABNORMAL HIGH (ref 0.61–1.24)
GFR, Estimated: 58 mL/min — ABNORMAL LOW (ref >60)
Glucose, Bld: 148 mg/dL — ABNORMAL HIGH (ref 70–99)
Potassium: 5.4 mmol/L — ABNORMAL HIGH (ref 3.5–5.1)
Sodium: 133 mmol/L — ABNORMAL LOW (ref 135–145)

## 2020-12-05 LAB — CBC
HCT: 45.1 % (ref 39.0–52.0)
Hemoglobin: 14.5 g/dL (ref 13.0–17.0)
MCH: 26.8 pg (ref 26.0–34.0)
MCHC: 32.2 g/dL (ref 30.0–36.0)
MCV: 83.2 fL (ref 80.0–100.0)
Platelets: 325 10*3/uL (ref 150–400)
RBC: 5.42 MIL/uL (ref 4.22–5.81)
RDW: 13 % (ref 11.5–15.5)
WBC: 11.7 10*3/uL — ABNORMAL HIGH (ref 4.0–10.5)
nRBC: 0 % (ref 0.0–0.2)

## 2020-12-05 LAB — POTASSIUM: Potassium: 4.2 mmol/L (ref 3.5–5.1)

## 2020-12-05 LAB — PHOSPHORUS: Phosphorus: 5.1 mg/dL — ABNORMAL HIGH (ref 2.5–4.6)

## 2020-12-05 MED ORDER — MENTHOL 3 MG MT LOZG
1.0000 | LOZENGE | OROMUCOSAL | Status: DC | PRN
  Administered 2020-12-07: 3 mg via ORAL
  Filled 2020-12-05: qty 9

## 2020-12-05 MED ORDER — SODIUM CHLORIDE 0.9 % IV SOLN: INTRAVENOUS | Status: DC

## 2020-12-05 MED ORDER — PIPERACILLIN-TAZOBACTAM 3.375 G IVPB
3.3750 g | Freq: Three times a day (TID) | INTRAVENOUS | Status: DC
  Administered 2020-12-05 – 2020-12-09 (×12): 3.375 g via INTRAVENOUS
  Filled 2020-12-05 (×12): qty 50

## 2020-12-05 NOTE — Plan of Care (Signed)
  Problem: Education: Goal: Knowledge of General Education information will improve Description: Including pain rating scale, medication(s)/side effects and non-pharmacologic comfort measures Outcome: Progressing   Problem: Health Behavior/Discharge Planning: Goal: Ability to manage health-related needs will improve Outcome: Progressing   Problem: Clinical Measurements: Goal: Ability to maintain clinical measurements within normal limits will improve Outcome: Progressing Goal: Will remain free from infection Outcome: Progressing Goal: Diagnostic test results will improve Outcome: Progressing Goal: Respiratory complications will improve Outcome: Progressing Goal: Cardiovascular complication will be avoided Outcome: Progressing   Problem: Activity: Goal: Risk for activity intolerance will decrease Outcome: Progressing   Problem: Nutrition: Goal: Adequate nutrition will be maintained Outcome: Progressing   Problem: Coping: Goal: Level of anxiety will decrease Outcome: Progressing   Problem: Elimination: Goal: Will not experience complications related to bowel motility Outcome: Progressing Goal: Will not experience complications related to urinary retention Outcome: Progressing   Problem: Pain Managment: Goal: General experience of comfort will improve Outcome: Progressing   Problem: Safety: Goal: Ability to remain free from injury will improve Outcome: Progressing   Problem: Skin Integrity: Goal: Risk for impaired skin integrity will decrease Outcome: Progressing   Problem: Activity: Goal: Ability to tolerate increased activity will improve Outcome: Progressing   Problem: Bowel/Gastric: Goal: Gastrointestinal status for postoperative course will improve Outcome: Progressing   Problem: Respiratory: Goal: Respiratory status will improve Outcome: Progressing   Problem: Skin Integrity: Goal: Will show signs of wound healing Outcome: Progressing

## 2020-12-05 NOTE — Progress Notes (Signed)
1 Day Post-Op  Subjective: Patient complains of incisional pain.  Alert and oriented.  Objective: Vital signs in last 24 hours: Temp:  [97.6 F (36.4 C)-98.3 F (36.8 C)] 97.6 F (36.4 C) (11/06 0758) Pulse Rate:  [114-142] 114 (11/06 1000) Resp:  [12-42] 16 (11/06 1000) BP: (71-118)/(50-92) 109/63 (11/06 1000) SpO2:  [94 %-100 %] 94 % (11/06 1000) Weight:  [70.6 kg] 70.6 kg (11/05 2100)    Intake/Output from previous day: 11/05 0701 - 11/06 0700 In: 4669.1 [I.V.:3619.1; IV Piggyback:1050] Out: 1075 [Urine:675; Emesis/NG output:125; Drains:250; Blood:25] Intake/Output this shift: Total I/O In: 450 [I.V.:450] Out: -   General appearance: alert, cooperative, and anxious Resp: clear to auscultation bilaterally Cardio: Tachycardic but no S3, S4, murmurs GI: Flat, dressing dry and intact.  JP drainage still cloudy.  Lab Results:  Recent Labs    12/04/20 1150 12/05/20 0120  WBC 18.1* 11.7*  HGB 17.1* 14.5  HCT 52.2* 45.1  PLT 476* 325   BMET Recent Labs    12/04/20 1150 12/05/20 0120  NA 131* 133*  K 4.1 5.4*  CL 94* 100  CO2 24 26  GLUCOSE 212* 148*  BUN 12 18  CREATININE 1.57* 1.55*  CALCIUM 9.3 8.0*   PT/INR No results for input(s): LABPROT, INR in the last 72 hours.  Studies/Results: CT ABDOMEN PELVIS W CONTRAST  Result Date: 12/04/2020 CLINICAL DATA:  Upper abdominal pain for 2 weeks with increased pain last night. EXAM: CT ABDOMEN AND PELVIS WITH CONTRAST TECHNIQUE: Multidetector CT imaging of the abdomen and pelvis was performed using the standard protocol following bolus administration of intravenous contrast. CONTRAST:  OMNIPAQUE IOHEXOL 300 MG/ML  SOLN COMPARISON:  None. FINDINGS: Lower chest: No acute abnormality. Hepatobiliary: No focal liver abnormality is seen. No gallstones, gallbladder wall thickening, or biliary dilatation. Pancreas: Unremarkable. No pancreatic ductal dilatation or surrounding inflammatory changes. Spleen: Normal in size  without focal abnormality. Adrenals/Urinary Tract: Adrenal glands are unremarkable. Kidneys are normal, without renal calculi, focal lesion, or hydronephrosis. Minimally distended urinary bladder. Stomach/Bowel: Diffuse wall thickening of the lower gastric body with marked wall thickening and edema in the gastric antrum. There is a 0.6 cm focal area of mucosal discontinuity along the posterior wall of the gastric antrum with adjacent free intraperitoneal air and fluid, best appreciated on sagittal series (series 6, image 60; series 2, image 26). Multiple dilated loops of small bowel measuring up to 3 cm in diameter. The gas-filled transverse colon is also dilated measuring 6 cm in diameter. Normal appendix in the right lower abdomen. Vascular/Lymphatic: No significant vascular findings are present. No enlarged abdominal or pelvic lymph nodes. Reproductive: Prostatic calcifications. Other: Small amount of free intraperitoneal air, predominantly in the upper abdomen. Moderate volume of abdominopelvic ascites. Musculoskeletal: No acute or significant osseous findings. IMPRESSION: 1. Perforated 0.6 cm ulcer along the posterior wall of the gastric antrum with small amount of free intraperitoneal air and moderate abdominopelvic ascites. 2. Multiple dilated loops of small bowel and dilated transverse colon, suggestive of developing ileus. Critical Value/emergent results were called by telephone at the time of interpretation on 12/04/2020 at 4:07 pm to provider [facility] , who verbally acknowledged these results. Electronically Signed   By: Sherron Ales M.D.   On: 12/04/2020 16:16   US Abdomen Limited  Result Date: 12/04/2020 CLINICAL DATA:  Abdominal pain EXAM: ULTRASOUND ABDOMEN LIMITED RIGHT UPPER QUADRANT COMPARISON:  None. FINDINGS: Gallbladder: No gallstones or wall thickening visualized. No sonographic Murphy sign noted by sonographer. Common bile  duct: Diameter: 4.7 mm Liver: No focal lesion identified. Within  normal limits in parenchymal echogenicity. Portal vein is patent on color Doppler imaging with normal direction of blood flow towards the liver. Other: None. IMPRESSION: Negative right upper quadrant ultrasound.  Negative for gallstone. Electronically Signed   By: Marlan Palau M.D.   On: 12/04/2020 13:04    Anti-infectives: Anti-infectives (From admission, onward)    Start     Dose/Rate Route Frequency Ordered Stop   12/05/20 0900  piperacillin-tazobactam (ZOSYN) IVPB 3.375 g        3.375 g 12.5 mL/hr over 240 Minutes Intravenous Every 8 hours 12/05/20 0752     12/05/20 0000  piperacillin-tazobactam (ZOSYN) IVPB 3.375 g  Status:  Discontinued        3.375 g 12.5 mL/hr over 240 Minutes Intravenous Every 8 hours 12/04/20 2009 12/05/20 0751   12/04/20 1615  piperacillin-tazobactam (ZOSYN) IVPB 3.375 g        3.375 g 100 mL/hr over 30 Minutes Intravenous  Once 12/04/20 1612 12/04/20 1650       Assessment/Plan: s/p Procedure(s): EXPLORATORY LAPAROTOMY GASTRORRHAPHY Impression: Stable on postoperative day 1.  Still with sinus tachycardia secondary to preoperative peritonitis and cocaine use. Plan: Hyperkalemia noted.  We will repeat potassium level.  Renal function slightly improved.  Continue normal saline at 150 cc an hour.  Use Ativan to supplement Dilaudid.  LOS: 1 day    Steve Frazier 12/05/2020

## 2020-12-06 LAB — CBC
HCT: 37.3 % — ABNORMAL LOW (ref 39.0–52.0)
Hemoglobin: 11.8 g/dL — ABNORMAL LOW (ref 13.0–17.0)
MCH: 26.9 pg (ref 26.0–34.0)
MCHC: 31.6 g/dL (ref 30.0–36.0)
MCV: 85 fL (ref 80.0–100.0)
Platelets: 241 10*3/uL (ref 150–400)
RBC: 4.39 MIL/uL (ref 4.22–5.81)
RDW: 13.1 % (ref 11.5–15.5)
WBC: 13.5 10*3/uL — ABNORMAL HIGH (ref 4.0–10.5)
nRBC: 0 % (ref 0.0–0.2)

## 2020-12-06 LAB — BASIC METABOLIC PANEL
Anion gap: 5 (ref 5–15)
BUN: 17 mg/dL (ref 6–20)
CO2: 28 mmol/L (ref 22–32)
Calcium: 7.9 mg/dL — ABNORMAL LOW (ref 8.9–10.3)
Chloride: 101 mmol/L (ref 98–111)
Creatinine, Ser: 1.35 mg/dL — ABNORMAL HIGH (ref 0.61–1.24)
GFR, Estimated: >60 mL/min (ref >60)
Glucose, Bld: 89 mg/dL (ref 70–99)
Potassium: 4 mmol/L (ref 3.5–5.1)
Sodium: 134 mmol/L — ABNORMAL LOW (ref 135–145)

## 2020-12-06 LAB — H. PYLORI ANTIBODY, IGG: H Pylori IgG: 2.37 Index Value — ABNORMAL HIGH (ref 0.00–0.79)

## 2020-12-06 LAB — MAGNESIUM: Magnesium: 1.9 mg/dL (ref 1.7–2.4)

## 2020-12-06 LAB — PHOSPHORUS: Phosphorus: 3.2 mg/dL (ref 2.5–4.6)

## 2020-12-06 NOTE — Progress Notes (Signed)
Pt ambulated in hallway approx. 200 feet. Tolerated fair, complained of lots of pain once back to bed. PRN meds givn. Dr. Lovell Sheehan made aware, will continue to monitor.

## 2020-12-06 NOTE — Progress Notes (Signed)
2 Days Post-Op  Subjective: Patient still having mild incisional pain.  Objective: Vital signs in last 24 hours: Temp:  [98.2 F (36.8 C)-99.9 F (37.7 C)] 98.2 F (36.8 C) (11/07 1206) Pulse Rate:  [109-138] 116 (11/07 1200) Resp:  [14-36] 22 (11/07 1200) BP: (96-164)/(54-76) 121/70 (11/07 1200) SpO2:  [93 %-98 %] 95 % (11/07 1200)    Intake/Output from previous day: 11/06 0701 - 11/07 0700 In: 3667.1 [I.V.:3529.8; IV Piggyback:137.3] Out: 2142 [Urine:1020; Emesis/NG output:925; Drains:197] Intake/Output this shift: No intake/output data recorded.  General appearance: alert, cooperative, and no distress Resp: clear to auscultation bilaterally Cardio: Sinus tachycardia with regular rhythm GI: Soft, flat.  Incision healing well.  JP drainage clear yellow at this point.  Lab Results:  Recent Labs    12/05/20 0120 12/06/20 0512  WBC 11.7* 13.5*  HGB 14.5 11.8*  HCT 45.1 37.3*  PLT 325 241   BMET Recent Labs    12/05/20 0120 12/05/20 1233 12/06/20 0512  NA 133*  --  134*  K 5.4* 4.2 4.0  CL 100  --  101  CO2 26  --  28  GLUCOSE 148*  --  89  BUN 18  --  17  CREATININE 1.55*  --  1.35*  CALCIUM 8.0*  --  7.9*   PT/INR No results for input(s): LABPROT, INR in the last 72 hours.  Studies/Results: CT ABDOMEN PELVIS W CONTRAST  Result Date: 12/04/2020 CLINICAL DATA:  Upper abdominal pain for 2 weeks with increased pain last night. EXAM: CT ABDOMEN AND PELVIS WITH CONTRAST TECHNIQUE: Multidetector CT imaging of the abdomen and pelvis was performed using the standard protocol following bolus administration of intravenous contrast. CONTRAST:  OMNIPAQUE IOHEXOL 300 MG/ML  SOLN COMPARISON:  None. FINDINGS: Lower chest: No acute abnormality. Hepatobiliary: No focal liver abnormality is seen. No gallstones, gallbladder wall thickening, or biliary dilatation. Pancreas: Unremarkable. No pancreatic ductal dilatation or surrounding inflammatory changes. Spleen: Normal in  size without focal abnormality. Adrenals/Urinary Tract: Adrenal glands are unremarkable. Kidneys are normal, without renal calculi, focal lesion, or hydronephrosis. Minimally distended urinary bladder. Stomach/Bowel: Diffuse wall thickening of the lower gastric body with marked wall thickening and edema in the gastric antrum. There is a 0.6 cm focal area of mucosal discontinuity along the posterior wall of the gastric antrum with adjacent free intraperitoneal air and fluid, best appreciated on sagittal series (series 6, image 60; series 2, image 26). Multiple dilated loops of small bowel measuring up to 3 cm in diameter. The gas-filled transverse colon is also dilated measuring 6 cm in diameter. Normal appendix in the right lower abdomen. Vascular/Lymphatic: No significant vascular findings are present. No enlarged abdominal or pelvic lymph nodes. Reproductive: Prostatic calcifications. Other: Small amount of free intraperitoneal air, predominantly in the upper abdomen. Moderate volume of abdominopelvic ascites. Musculoskeletal: No acute or significant osseous findings. IMPRESSION: 1. Perforated 0.6 cm ulcer along the posterior wall of the gastric antrum with small amount of free intraperitoneal air and moderate abdominopelvic ascites. 2. Multiple dilated loops of small bowel and dilated transverse colon, suggestive of developing ileus. Critical Value/emergent results were called by telephone at the time of interpretation on 12/04/2020 at 4:07 pm to provider [facility] , who verbally acknowledged these results. Electronically Signed   By: Sherron Ales M.D.   On: 12/04/2020 16:16    Anti-infectives: Anti-infectives (From admission, onward)    Start     Dose/Rate Route Frequency Ordered Stop   12/05/20 0900  piperacillin-tazobactam (ZOSYN) IVPB  3.375 g        3.375 g 12.5 mL/hr over 240 Minutes Intravenous Every 8 hours 12/05/20 0752     12/05/20 0000  piperacillin-tazobactam (ZOSYN) IVPB 3.375 g  Status:   Discontinued        3.375 g 12.5 mL/hr over 240 Minutes Intravenous Every 8 hours 12/04/20 2009 12/05/20 0751   12/04/20 1615  piperacillin-tazobactam (ZOSYN) IVPB 3.375 g        3.375 g 100 mL/hr over 30 Minutes Intravenous  Once 12/04/20 1612 12/04/20 1650       Assessment/Plan: s/p Procedure(s): EXPLORATORY LAPAROTOMY GASTRORRHAPHY Impression: Stable on postoperative day 2.  Still with mild tachycardia most likely secondary to cocaine and peritonitis.  Urine output has improved. Plan: We will get upper GI series to assess Cheree Ditto patch tomorrow.  Further management is pending those results.  LOS: 2 days    Franky Macho 12/06/2020

## 2020-12-06 NOTE — Plan of Care (Signed)
  Problem: Education: Goal: Knowledge of General Education information will improve Description: Including pain rating scale, medication(s)/side effects and non-pharmacologic comfort measures Outcome: Progressing   Problem: Health Behavior/Discharge Planning: Goal: Ability to manage health-related needs will improve Outcome: Progressing   Problem: Clinical Measurements: Goal: Ability to maintain clinical measurements within normal limits will improve Outcome: Progressing Goal: Will remain free from infection Outcome: Progressing Goal: Diagnostic test results will improve Outcome: Progressing Goal: Respiratory complications will improve Outcome: Progressing Goal: Cardiovascular complication will be avoided Outcome: Progressing   Problem: Activity: Goal: Risk for activity intolerance will decrease Outcome: Progressing   Problem: Nutrition: Goal: Adequate nutrition will be maintained Outcome: Progressing   Problem: Coping: Goal: Level of anxiety will decrease Outcome: Progressing   Problem: Elimination: Goal: Will not experience complications related to bowel motility Outcome: Progressing Goal: Will not experience complications related to urinary retention Outcome: Progressing   Problem: Pain Managment: Goal: General experience of comfort will improve Outcome: Progressing   Problem: Safety: Goal: Ability to remain free from injury will improve Outcome: Progressing   Problem: Skin Integrity: Goal: Risk for impaired skin integrity will decrease Outcome: Progressing   Problem: Activity: Goal: Ability to tolerate increased activity will improve Outcome: Progressing   Problem: Bowel/Gastric: Goal: Gastrointestinal status for postoperative course will improve Outcome: Progressing   Problem: Respiratory: Goal: Respiratory status will improve Outcome: Progressing   Problem: Skin Integrity: Goal: Will show signs of wound healing Outcome: Progressing

## 2020-12-07 ENCOUNTER — Inpatient Hospital Stay (HOSPITAL_COMMUNITY): Payer: 59

## 2020-12-07 MED ORDER — IOHEXOL 300 MG/ML  SOLN
100.0000 mL | Freq: Once | INTRAMUSCULAR | Status: AC | PRN
  Administered 2020-12-07: 100 mL

## 2020-12-07 NOTE — Progress Notes (Signed)
Patient lying in bed, anxious, sweating and c/o being hot. Restless in bed and fidgeting with blankets. Ativan given along with due pain meds. Will continue to monitor.

## 2020-12-07 NOTE — Progress Notes (Signed)
   12/07/20 2100  Vitals  Temp 97.8 F (36.6 C)  Temp Source Oral  BP (!) 116/94  MAP (mmHg) 102  BP Location Left Arm  BP Method Automatic  Patient Position (if appropriate) Lying  Pulse Rate (!) 120  Pulse Rate Source Monitor  Resp 20  MEWS COLOR  MEWS Score Color Yellow  Oxygen Therapy  SpO2 96 %  O2 Device Room Air  MEWS Score  MEWS Temp 0  MEWS Systolic 0  MEWS Pulse 2  MEWS RR 0  MEWS LOC 0  MEWS Score 2   Patient score decreased from Red to Yellow Mews. Will take vitals q2h and continue to monitor.

## 2020-12-07 NOTE — Progress Notes (Signed)
   12/07/20 1452  Assess: MEWS Score  Temp 97.8 F (36.6 C)  BP (!) 142/99  Pulse Rate (!) 126  Resp (!) 26  Level of Consciousness Alert  SpO2 96 %  O2 Device Room Air  Assess: MEWS Score  MEWS Temp 0  MEWS Systolic 0  MEWS Pulse 2  MEWS RR 2  MEWS LOC 0  MEWS Score 4  MEWS Score Color Red  Treat  Pain Scale 0-10  Pain Score 6  Pain Type Surgical pain  Pain Location Abdomen  Pain Orientation Right;Upper  Pain Descriptors / Indicators Discomfort;Aching  Pain Frequency Intermittent  Pain Onset On-going  Patients Stated Pain Goal 0  Pain Intervention(s) Medication (See eMAR);Cold applied  Take Vital Signs  Increase Vital Sign Frequency  Red: Q 1hr X 4 then Q 4hr X 4, if remains red, continue Q 4hrs  Escalate  MEWS: Escalate Red: discuss with charge nurse/RN and provider, consider discussing with RRT  Notify: Charge Nurse/RN  Name of Charge Nurse/RN Notified Chales Abrahams RN  Date Charge Nurse/RN Notified 12/07/20  Time Charge Nurse/RN Notified 1500  Notify: Provider  Provider Name/Title Dr. Lovell Sheehan  Date Provider Notified 12/07/20  Time Provider Notified 1455  Notification Type Page  Notification Reason Other (Comment)  Provider response No new orders  Date of Provider Response 12/07/20  Time of Provider Response 1456

## 2020-12-07 NOTE — Progress Notes (Signed)
3 Days Post-Op  Subjective: Patient still with mild to moderate incisional pain.  No bowel movement yet.  Objective: Vital signs in last 24 hours: Temp:  [98.2 F (36.8 C)-100.5 F (38.1 C)] 98.6 F (37 C) (11/08 0700) Pulse Rate:  [108-124] 110 (11/08 0800) Resp:  [15-22] 18 (11/08 0800) BP: (114-139)/(67-87) 119/87 (11/08 0800) SpO2:  [92 %-99 %] 94 % (11/08 0800) Weight:  [74.5 kg] 74.5 kg (11/08 0334)    Intake/Output from previous day: 11/07 0701 - 11/08 0700 In: 3564.3 [I.V.:3423.6; IV Piggyback:140.6] Out: 2470 [Urine:850; Emesis/NG output:1550; Drains:70] Intake/Output this shift: Total I/O In: 577.8 [I.V.:562.8; IV Piggyback:15] Out: -   General appearance: alert, cooperative, and no distress Resp: clear to auscultation bilaterally Cardio: Sinus tachycardia GI: Soft, flat.  JP drainage serous in nature.  No purulent drainage noted.  Incision healing well.  Lab Results:  Recent Labs    12/05/20 0120 12/06/20 0512  WBC 11.7* 13.5*  HGB 14.5 11.8*  HCT 45.1 37.3*  PLT 325 241   BMET Recent Labs    12/05/20 0120 12/05/20 1233 12/06/20 0512  NA 133*  --  134*  K 5.4* 4.2 4.0  CL 100  --  101  CO2 26  --  28  GLUCOSE 148*  --  89  BUN 18  --  17  CREATININE 1.55*  --  1.35*  CALCIUM 8.0*  --  7.9*   PT/INR No results for input(s): LABPROT, INR in the last 72 hours.  Studies/Results: DG UGI W SINGLE CM (SOL OR THIN BA)  Result Date: 12/07/2020 CLINICAL DATA:  Recent abdominal surgery with perforated pre-pyloric ulcer. EXAM: UPPER GI SERIES WITH KUB TECHNIQUE: After obtaining a scout radiograph a routine upper GI series was performed using 200 cc of Omnipaque 300 FLUOROSCOPY TIME:  Fluoroscopy Time:  1 minute, 36 seconds Radiation Exposure Index (if provided by the fluoroscopic device): 13.8 mGy Number of Acquired Spot Images: 4 COMPARISON:  CT abdomen 12/04/2020 FINDINGS: Initial KUB demonstrates mildly dilated loops of small bowel in the left upper  quadrant, a nasogastric tube with tip in the stomach body, and a right upper quadrant drain. Skin clips noted in the midline. Omnipaque 300 contrast medium was slowly instilled through the nasogastric tube with careful observation of the stomach during installation, with the patient in the supine position. Subsequently, we gently turn the patient into the LPO and RPO positions as tolerated by the patient's pain/discomfort and obtained fluoroscopic and spot film imaging in these projections. Contrast medium is noted to flow from the stomach into the duodenum. There is a suggestion of some fold irregularity/fold thickening in the gastric antrum for example on image 15 series 6, but no leakage of contrast is identified. No contrast is noted to extend over adjacent to the drain. The nasogastric tube was flushed with water after the contrast injections in order to clear the tube of contrast. IMPRESSION: 1. No current findings of leak from the stomach in the region of the plication. Mild fold thickening in the distal stomach is likely a manifestation of the patient's peptic ulcer disease. 2. Left upper quadrant loops of mildly dilated small bowel, probably from ileus but technically nonspecific. Electronically Signed   By: Gaylyn Rong M.D.   On: 12/07/2020 09:33    Anti-infectives: Anti-infectives (From admission, onward)    Start     Dose/Rate Route Frequency Ordered Stop   12/05/20 0900  piperacillin-tazobactam (ZOSYN) IVPB 3.375 g  3.375 g 12.5 mL/hr over 240 Minutes Intravenous Every 8 hours 12/05/20 0752     12/05/20 0000  piperacillin-tazobactam (ZOSYN) IVPB 3.375 g  Status:  Discontinued        3.375 g 12.5 mL/hr over 240 Minutes Intravenous Every 8 hours 12/04/20 2009 12/05/20 0751   12/04/20 1615  piperacillin-tazobactam (ZOSYN) IVPB 3.375 g        3.375 g 100 mL/hr over 30 Minutes Intravenous  Once 12/04/20 1612 12/04/20 1650       Assessment/Plan: s/p Procedure(s): EXPLORATORY  LAPAROTOMY GASTRORRHAPHY Impression: Stable on postoperative day 3.  Upper GI shows no leak from the [facility] plication.  We will pull NG tube.  Start clear liquid diet.  Patient is H. pylori positive.  We will treat it upon discharge.  LOS: 3 days    Franky Macho 12/07/2020

## 2020-12-07 NOTE — Progress Notes (Signed)
Small amount of serous colored fluid noted from insertion site of JP drain, saturating dressing. Dr Lovell Sheehan made aware. JP dressing changed and JP tube squeezed per Dr Lovell Sheehan to declot drain per verbal from Dr Lovell Sheehan. Dr Lovell Sheehan also aware of patient heart rate being tachy in the 120's.

## 2020-12-08 ENCOUNTER — Encounter (HOSPITAL_COMMUNITY): Payer: Self-pay | Admitting: General Surgery

## 2020-12-08 LAB — BASIC METABOLIC PANEL
Anion gap: 8 (ref 5–15)
BUN: 12 mg/dL (ref 6–20)
CO2: 27 mmol/L (ref 22–32)
Calcium: 7.9 mg/dL — ABNORMAL LOW (ref 8.9–10.3)
Chloride: 103 mmol/L (ref 98–111)
Creatinine, Ser: 0.84 mg/dL (ref 0.61–1.24)
GFR, Estimated: >60 mL/min (ref >60)
Glucose, Bld: 126 mg/dL — ABNORMAL HIGH (ref 70–99)
Potassium: 3.6 mmol/L (ref 3.5–5.1)
Sodium: 138 mmol/L (ref 135–145)

## 2020-12-08 LAB — CBC
HCT: 35.2 % — ABNORMAL LOW (ref 39.0–52.0)
Hemoglobin: 11.3 g/dL — ABNORMAL LOW (ref 13.0–17.0)
MCH: 26.7 pg (ref 26.0–34.0)
MCHC: 32.1 g/dL (ref 30.0–36.0)
MCV: 83.2 fL (ref 80.0–100.0)
Platelets: 284 10*3/uL (ref 150–400)
RBC: 4.23 MIL/uL (ref 4.22–5.81)
RDW: 13.3 % (ref 11.5–15.5)
WBC: 9.4 10*3/uL (ref 4.0–10.5)
nRBC: 0 % (ref 0.0–0.2)

## 2020-12-08 LAB — SURGICAL PATHOLOGY

## 2020-12-08 LAB — MAGNESIUM: Magnesium: 2 mg/dL (ref 1.7–2.4)

## 2020-12-08 LAB — PHOSPHORUS: Phosphorus: 2.8 mg/dL (ref 2.5–4.6)

## 2020-12-08 MED ORDER — OXYCODONE-ACETAMINOPHEN 5-325 MG PO TABS
1.0000 | ORAL_TABLET | ORAL | Status: DC | PRN
  Administered 2020-12-08 – 2020-12-10 (×7): 1 via ORAL
  Filled 2020-12-08 (×7): qty 1

## 2020-12-08 MED ORDER — PANTOPRAZOLE SODIUM 40 MG PO TBEC
40.0000 mg | DELAYED_RELEASE_TABLET | Freq: Every day | ORAL | Status: DC
  Administered 2020-12-08: 40 mg via ORAL
  Filled 2020-12-08: qty 1

## 2020-12-08 NOTE — Progress Notes (Signed)
4 Days Post-Op  Subjective: Still with incisional pain in the epigastric region but is starting to ambulate.  Has been passing flatus.  Objective: Vital signs in last 24 hours: Temp:  [97.8 F (36.6 C)-98.1 F (36.7 C)] 98.1 F (36.7 C) (11/09 0554) Pulse Rate:  [115-126] 115 (11/09 0554) Resp:  [16-26] 19 (11/09 0554) BP: (109-149)/(76-99) 122/81 (11/09 0554) SpO2:  [95 %-96 %] 95 % (11/09 0554)    Intake/Output from previous day: 11/08 0701 - 11/09 0700 In: 1036.8 [P.O.:240; I.V.:770.8; IV Piggyback:26] Out: 1300 [Urine:1000; Drains:300] Intake/Output this shift: Total I/O In: 592 [P.O.:592] Out: 175 [Drains:175]  General appearance: alert, cooperative, and no distress Resp: clear to auscultation bilaterally Cardio: Sinus tachycardia GI: Soft, flat.  Incision healing well.  JP drainage with serous drainage present.  Lab Results:  Recent Labs    12/06/20 0512 12/08/20 0451  WBC 13.5* 9.4  HGB 11.8* 11.3*  HCT 37.3* 35.2*  PLT 241 284   BMET Recent Labs    12/06/20 0512 12/08/20 0451  NA 134* 138  K 4.0 3.6  CL 101 103  CO2 28 27  GLUCOSE 89 126*  BUN 17 12  CREATININE 1.35* 0.84  CALCIUM 7.9* 7.9*   PT/INR No results for input(s): LABPROT, INR in the last 72 hours.  Studies/Results: DG UGI W SINGLE CM (SOL OR THIN BA)  Result Date: 12/07/2020 CLINICAL DATA:  Recent abdominal surgery with perforated pre-pyloric ulcer. EXAM: UPPER GI SERIES WITH KUB TECHNIQUE: After obtaining a scout radiograph a routine upper GI series was performed using 200 cc of Omnipaque 300 FLUOROSCOPY TIME:  Fluoroscopy Time:  1 minute, 36 seconds Radiation Exposure Index (if provided by the fluoroscopic device): 13.8 mGy Number of Acquired Spot Images: 4 COMPARISON:  CT abdomen 12/04/2020 FINDINGS: Initial KUB demonstrates mildly dilated loops of small bowel in the left upper quadrant, a nasogastric tube with tip in the stomach body, and a right upper quadrant drain. Skin clips noted  in the midline. Omnipaque 300 contrast medium was slowly instilled through the nasogastric tube with careful observation of the stomach during installation, with the patient in the supine position. Subsequently, we gently turn the patient into the LPO and RPO positions as tolerated by the patient's pain/discomfort and obtained fluoroscopic and spot film imaging in these projections. Contrast medium is noted to flow from the stomach into the duodenum. There is a suggestion of some fold irregularity/fold thickening in the gastric antrum for example on image 15 series 6, but no leakage of contrast is identified. No contrast is noted to extend over adjacent to the drain. The nasogastric tube was flushed with water after the contrast injections in order to clear the tube of contrast. IMPRESSION: 1. No current findings of leak from the stomach in the region of the plication. Mild fold thickening in the distal stomach is likely a manifestation of the patient's peptic ulcer disease. 2. Left upper quadrant loops of mildly dilated small bowel, probably from ileus but technically nonspecific. Electronically Signed   By: Gaylyn Rong M.D.   On: 12/07/2020 09:33    Anti-infectives: Anti-infectives (From admission, onward)    Start     Dose/Rate Route Frequency Ordered Stop   12/05/20 0900  piperacillin-tazobactam (ZOSYN) IVPB 3.375 g        3.375 g 12.5 mL/hr over 240 Minutes Intravenous Every 8 hours 12/05/20 0752     12/05/20 0000  piperacillin-tazobactam (ZOSYN) IVPB 3.375 g  Status:  Discontinued  3.375 g 12.5 mL/hr over 240 Minutes Intravenous Every 8 hours 12/04/20 2009 12/05/20 0751   12/04/20 1615  piperacillin-tazobactam (ZOSYN) IVPB 3.375 g        3.375 g 100 mL/hr over 30 Minutes Intravenous  Once 12/04/20 1612 12/04/20 1650       Assessment/Plan: s/p Procedure(s): EXPLORATORY LAPAROTOMY GASTRORRHAPHY Impression: Stable on postoperative day 4.  Intraoperative cultures revealed rare  gram-positive cocci.  No ID at this present time.  We will continue Zosyn.  Advancing diet as tolerated.  Sinus tachycardia improving.  No need for treatment at this point.  LOS: 4 days    Steve Frazier 12/08/2020

## 2020-12-08 NOTE — Progress Notes (Signed)
   12/08/20 0100  Vitals  Temp 97.9 F (36.6 C)  Temp Source Oral  BP 109/76  BP Location Right Arm  BP Method Automatic  Patient Position (if appropriate) Lying  Pulse Rate (!) 122  Pulse Rate Source Monitor  Resp 16  MEWS COLOR  MEWS Score Color Yellow  Oxygen Therapy  SpO2 95 %  O2 Device Room Air  MEWS Score  MEWS Temp 0  MEWS Systolic 0  MEWS Pulse 2  MEWS RR 0  MEWS LOC 0  MEWS Score 2

## 2020-12-09 MED ORDER — AMOXICILLIN 250 MG PO CAPS
1000.0000 mg | ORAL_CAPSULE | Freq: Two times a day (BID) | ORAL | Status: DC
  Administered 2020-12-09 – 2020-12-10 (×3): 1000 mg via ORAL
  Filled 2020-12-09 (×3): qty 4

## 2020-12-09 MED ORDER — CLARITHROMYCIN 500 MG PO TABS
500.0000 mg | ORAL_TABLET | Freq: Two times a day (BID) | ORAL | Status: DC
  Administered 2020-12-09 – 2020-12-10 (×3): 500 mg via ORAL
  Filled 2020-12-09 (×3): qty 1

## 2020-12-09 MED ORDER — PANTOPRAZOLE SODIUM 40 MG PO TBEC
40.0000 mg | DELAYED_RELEASE_TABLET | Freq: Two times a day (BID) | ORAL | Status: DC
  Administered 2020-12-09 – 2020-12-10 (×3): 40 mg via ORAL
  Filled 2020-12-09 (×3): qty 1

## 2020-12-09 NOTE — Progress Notes (Signed)
Patient MEWs score 2, yellow, d/t elevated ECG recorded HR. Nurse rechecked HR, was 105 bpm. Charge nurse LeAnne aware of situation. Nurse did not escalate MEWs after recheck returned score to normal.

## 2020-12-09 NOTE — Progress Notes (Signed)
5 Days Post-Op  Subjective: Patient still having incisional pain and abdominal pain when coughing.  Objective: Vital signs in last 24 hours: Temp:  [97.5 F (36.4 C)-98.2 F (36.8 C)] 97.6 F (36.4 C) (11/10 0538) Pulse Rate:  [105-112] 105 (11/10 0538) Resp:  [15-20] 15 (11/10 0538) BP: (107-133)/(72-92) 133/92 (11/10 0538) SpO2:  [93 %-100 %] 96 % (11/10 0538) Last BM Date: 12/08/20  Intake/Output from previous day: 11/09 0701 - 11/10 0700 In: 1768 [P.O.:1768] Out: 800 [Urine:450; Drains:350] Intake/Output this shift: No intake/output data recorded.  General appearance: alert, cooperative, and anxious Resp: clear to auscultation bilaterally Cardio: Mild sinus tachycardia GI: Soft, flat.  Incision healing well.  JP drainage clear.  Lab Results:  Recent Labs    12/08/20 0451  WBC 9.4  HGB 11.3*  HCT 35.2*  PLT 284   BMET Recent Labs    12/08/20 0451  NA 138  K 3.6  CL 103  CO2 27  GLUCOSE 126*  BUN 12  CREATININE 0.84  CALCIUM 7.9*   PT/INR No results for input(s): LABPROT, INR in the last 72 hours.  Studies/Results: DG UGI W SINGLE CM (SOL OR THIN BA)  Result Date: 12/07/2020 CLINICAL DATA:  Recent abdominal surgery with perforated pre-pyloric ulcer. EXAM: UPPER GI SERIES WITH KUB TECHNIQUE: After obtaining a scout radiograph a routine upper GI series was performed using 200 cc of Omnipaque 300 FLUOROSCOPY TIME:  Fluoroscopy Time:  1 minute, 36 seconds Radiation Exposure Index (if provided by the fluoroscopic device): 13.8 mGy Number of Acquired Spot Images: 4 COMPARISON:  CT abdomen 12/04/2020 FINDINGS: Initial KUB demonstrates mildly dilated loops of small bowel in the left upper quadrant, a nasogastric tube with tip in the stomach body, and a right upper quadrant drain. Skin clips noted in the midline. Omnipaque 300 contrast medium was slowly instilled through the nasogastric tube with careful observation of the stomach during installation, with the patient  in the supine position. Subsequently, we gently turn the patient into the LPO and RPO positions as tolerated by the patient's pain/discomfort and obtained fluoroscopic and spot film imaging in these projections. Contrast medium is noted to flow from the stomach into the duodenum. There is a suggestion of some fold irregularity/fold thickening in the gastric antrum for example on image 15 series 6, but no leakage of contrast is identified. No contrast is noted to extend over adjacent to the drain. The nasogastric tube was flushed with water after the contrast injections in order to clear the tube of contrast. IMPRESSION: 1. No current findings of leak from the stomach in the region of the plication. Mild fold thickening in the distal stomach is likely a manifestation of the patient's peptic ulcer disease. 2. Left upper quadrant loops of mildly dilated small bowel, probably from ileus but technically nonspecific. Electronically Signed   By: Gaylyn Rong M.D.   On: 12/07/2020 09:33    Anti-infectives: Anti-infectives (From admission, onward)    Start     Dose/Rate Route Frequency Ordered Stop   12/09/20 1000  amoxicillin (AMOXIL) capsule 1,000 mg        1,000 mg Oral Every 12 hours 12/09/20 0831     12/09/20 1000  clarithromycin (BIAXIN) tablet 500 mg        500 mg Oral Every 12 hours 12/09/20 0831     12/05/20 0900  piperacillin-tazobactam (ZOSYN) IVPB 3.375 g  Status:  Discontinued        3.375 g 12.5 mL/hr over 240 Minutes Intravenous  Every 8 hours 12/05/20 0752 12/09/20 0831   12/05/20 0000  piperacillin-tazobactam (ZOSYN) IVPB 3.375 g  Status:  Discontinued        3.375 g 12.5 mL/hr over 240 Minutes Intravenous Every 8 hours 12/04/20 2009 12/05/20 0751   12/04/20 1615  piperacillin-tazobactam (ZOSYN) IVPB 3.375 g        3.375 g 100 mL/hr over 30 Minutes Intravenous  Once 12/04/20 1612 12/04/20 1650       Assessment/Plan: s/p Procedure(s): EXPLORATORY  LAPAROTOMY GASTRORRHAPHY Impression: Postoperative day 5, status post gastrorrhaphy for perforated peptic ulcer.  We will remove JP drain.  We will switch to clarithromycin and amoxicillin to start treatment for H. pylori.  Advancing to regular diet.  Anticipate discharge in next 24 to 48 hours.  We will DC telemetry.  LOS: 5 days    Steve Frazier 12/09/2020

## 2020-12-09 NOTE — Progress Notes (Signed)
Patient's abd JP drain dressing saturated in pink tinged fluid, site of JP drain had purulent drainage around stitching and slight redness at the site. Patient also complained of itching at the site. Nurse cleansed site with NS and replaced with new dressing. Patient has only had 44ml of drainage from JP drain this shift.

## 2020-12-10 LAB — AEROBIC/ANAEROBIC CULTURE W GRAM STAIN (SURGICAL/DEEP WOUND)

## 2020-12-10 MED ORDER — CLARITHROMYCIN 500 MG PO TABS: 500.0000 mg | ORAL_TABLET | Freq: Two times a day (BID) | ORAL | 0 refills | Status: AC

## 2020-12-10 MED ORDER — OXYCODONE-ACETAMINOPHEN 10-325 MG PO TABS: 1.0000 | ORAL_TABLET | Freq: Four times a day (QID) | ORAL | 0 refills | Status: AC | PRN

## 2020-12-10 MED ORDER — PANTOPRAZOLE SODIUM 40 MG PO TBEC: 40.0000 mg | DELAYED_RELEASE_TABLET | Freq: Two times a day (BID) | ORAL | 1 refills | Status: AC

## 2020-12-10 MED ORDER — AMOXICILLIN 500 MG PO CAPS: 1000.0000 mg | ORAL_CAPSULE | Freq: Two times a day (BID) | ORAL | 0 refills | Status: AC

## 2020-12-10 NOTE — Progress Notes (Signed)
Nsg Discharge Note  Admit Date:  12/04/2020 Discharge date: 12/10/2020   Steve Frazier to be D/C'd Home per MD order.  AVS completed.   Patient/caregiver able to verbalize understanding.  Discharge Medication: Allergies as of 12/10/2020       Reactions   Chlorhexidine         Medication List     STOP taking these medications    chlorhexidine 0.12 % solution Commonly known as: PERIDEX   DAYQUIL PO   docusate sodium 100 MG capsule Commonly known as: Colace   doxycycline 100 MG capsule Commonly known as: VIBRAMYCIN   HYDROcodone-acetaminophen 5-325 MG tablet Commonly known as: NORCO/VICODIN       TAKE these medications    amoxicillin 500 MG capsule Commonly known as: AMOXIL Take 2 capsules (1,000 mg total) by mouth 2 (two) times daily. What changed:  how much to take when to take this   clarithromycin 500 MG tablet Commonly known as: BIAXIN Take 1 tablet (500 mg total) by mouth every 12 (twelve) hours.   oxyCODONE-acetaminophen 10-325 MG tablet Commonly known as: Percocet Take 1 tablet by mouth every 6 (six) hours as needed for pain. What changed:  when to take this reasons to take this   pantoprazole 40 MG tablet Commonly known as: PROTONIX Take 1 tablet (40 mg total) by mouth 2 (two) times daily.        Discharge Assessment: Vitals:   12/09/20 2030 12/10/20 0435  BP: 129/87 (!) 129/93  Pulse: (!) 102 95  Resp: 14 17  Temp: 98.7 F (37.1 C) 97.7 F (36.5 C)  SpO2: 95% 97%   Skin clean, dry and intact without evidence of skin break down, no evidence of skin tears noted. IV catheter discontinued intact. Site without signs and symptoms of complications - no redness or edema noted at insertion site, patient denies c/o pain - only slight tenderness at site.  Dressing with slight pressure applied.  D/c Instructions-Education: Discharge instructions given to patient/family with verbalized understanding. D/c education completed with  patient/family including follow up instructions, medication list, d/c activities limitations if indicated, with other d/c instructions as indicated by MD - patient able to verbalize understanding, all questions fully answered. Patient instructed to return to ED, call 911, or call MD for any changes in condition.  Patient escorted via WC, and D/C home via private auto.  Verl Dicker, RN 12/10/2020 10:59 AM

## 2020-12-10 NOTE — Discharge Summary (Signed)
Physician Discharge Summary  Patient ID: Steve Frazier MRN: [id number] DOB/AGE: 38-Dec-1982 38 y.o.  Admit date: 12/04/2020 Discharge date: 12/10/2020  Admission Diagnoses: Perforated viscus  Discharge Diagnoses: Same, perforated peptic ulcer, H. pylori positive Active Problems:   Perforated peptic ulcer (HCC) History of cocaine use  Discharged Condition: Good  Hospital Course: Patient is a 40 year old white male who presented to the emergency room with a 5-day history of worsening epigastric pain.  CT scan of the abdomen revealed pneumoperitoneum with probable perforated peptic ulcer.  He was cocaine positive at the time of admission.  He was taken emergently to the operating room on 12/04/2020 and underwent Graham plication.  He tolerated the surgery well.  His postoperative course has been remarkable for getting his pain under control.  He was H. pylori positive.  On postoperative day 3, he underwent upper GI series which revealed no leak from the repair.  His NG tube was thus removed as diet was advanced without difficulty.  Patient is being discharged home on 12/10/2020 in good and improving condition.   Treatments: surgery: Gastrorrhaphy on 12/04/2020  Discharge Exam: Blood pressure (!) 129/93, pulse 95, temperature 97.7 F (36.5 C), temperature source Oral, resp. rate 17, height 5\' 11"  (1.803 m), weight 74.5 kg, SpO2 97 %. General appearance: alert, cooperative, and no distress Resp: clear to auscultation bilaterally Cardio: regular rate and rhythm, S1, S2 normal, no murmur, click, rub or gallop GI: Soft, flat.  Incision healing well.  JP drain already removed.  Disposition: Discharge disposition: 01-Home or Self Care       Discharge Instructions     Diet - low sodium heart healthy   Complete by: As directed    Increase activity slowly   Complete by: As directed       Allergies as of 12/10/2020       Reactions   Chlorhexidine         Medication List      STOP taking these medications    chlorhexidine 0.12 % solution Commonly known as: PERIDEX   DAYQUIL PO   docusate sodium 100 MG capsule Commonly known as: Colace   doxycycline 100 MG capsule Commonly known as: VIBRAMYCIN   HYDROcodone-acetaminophen 5-325 MG tablet Commonly known as: NORCO/VICODIN       TAKE these medications    amoxicillin 500 MG capsule Commonly known as: AMOXIL Take 2 capsules (1,000 mg total) by mouth 2 (two) times daily. What changed:  how much to take when to take this   clarithromycin 500 MG tablet Commonly known as: BIAXIN Take 1 tablet (500 mg total) by mouth every 12 (twelve) hours.   oxyCODONE-acetaminophen 10-325 MG tablet Commonly known as: Percocet Take 1 tablet by mouth every 6 (six) hours as needed for pain. What changed:  when to take this reasons to take this   pantoprazole 40 MG tablet Commonly known as: PROTONIX Take 1 tablet (40 mg total) by mouth 2 (two) times daily.        Follow-up Information     13/11/2020, MD. Schedule an appointment as soon as possible for a visit on 12/16/2020.   Specialty: General Surgery Contact information: [id number] [place] [phone]                 Signed: [phone] 12/10/2020, 11:32 AM

## 2020-12-16 ENCOUNTER — Encounter: Payer: Self-pay | Admitting: General Surgery

## 2020-12-16 ENCOUNTER — Ambulatory Visit (INDEPENDENT_AMBULATORY_CARE_PROVIDER_SITE_OTHER): Payer: 59 | Admitting: General Surgery

## 2020-12-16 ENCOUNTER — Other Ambulatory Visit: Payer: Self-pay

## 2020-12-16 VITALS — BP 127/85 | HR 94 | Temp 97.9°F | Resp 16 | Ht 71.0 in | Wt 157.0 lb

## 2020-12-16 DIAGNOSIS — Z09 Encounter for follow-up examination after completed treatment for conditions other than malignant neoplasm: Secondary | ICD-10-CM

## 2020-12-16 NOTE — Progress Notes (Signed)
Subjective:     Steve Frazier  Patient here for postoperative visit, status post gastrorrhaphy for perforated peptic ulcer.  Patient states he is doing well.  He is taking the H. pylori regimen.  He denies any nausea or vomiting. Objective:    BP 127/85   Pulse 94   Temp 97.9 F (36.6 C) (Other (Comment))   Resp 16   Ht 5\' 11"  (1.803 m)   Wt 157 lb (71.2 kg)   SpO2 98%   BMI 21.90 kg/m   General:  alert, cooperative, and no distress  Abdomen soft, flat.  Incision healing well.  Staples removed, Steri-Strips applied.     Assessment:    Doing well postoperatively.    Plan:   Finish H. pylori treatment regimen.  He should continue Prevacid twice a day for 1 full month, then he can back off to once a day.  He can increase his activity as able.  He may return to work without restrictions on 12/22/2020.  Follow-up here as needed.

## 2022-11-10 IMAGING — RF DG UGI W SINGLE CM
11 of 12 series · 14 of 24 positions shown · IV contrast (omnipaque)
Comparison: CT abdomen 12/04/2020

CLINICAL DATA: Recent abdominal surgery with perforated pre-pyloric
ulcer.

EXAM:
UPPER GI SERIES WITH KUB
TECHNIQUE: After obtaining a scout radiograph a routine upper GI series was
performed using 200 cc of Omnipaque 300
FLUOROSCOPY TIME:  Fluoroscopy Time:  1 minute, 36 seconds
Radiation Exposure Index (if provided by the fluoroscopic device):
13.8 mGy
Number of Acquired Spot Images: 4

[Series 1: t ap supine · 0.15mm/px · 1 of 1 slices shown]
[im 1/1]
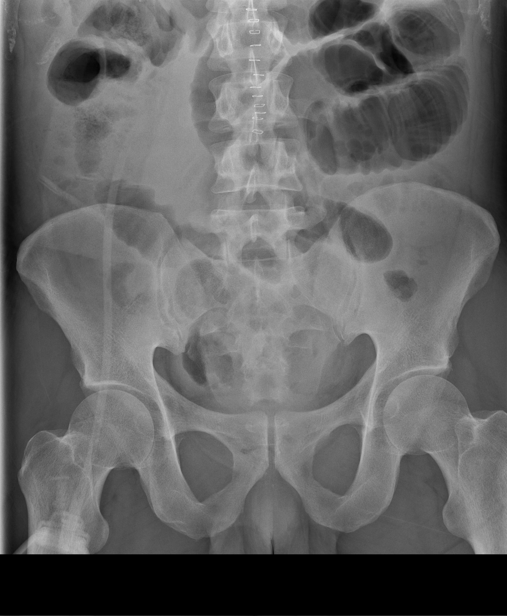

[Series 3: cp_standard · 0.25mm/px · 1 of 6 frames shown (1 of 7)]
[frame 4/6]
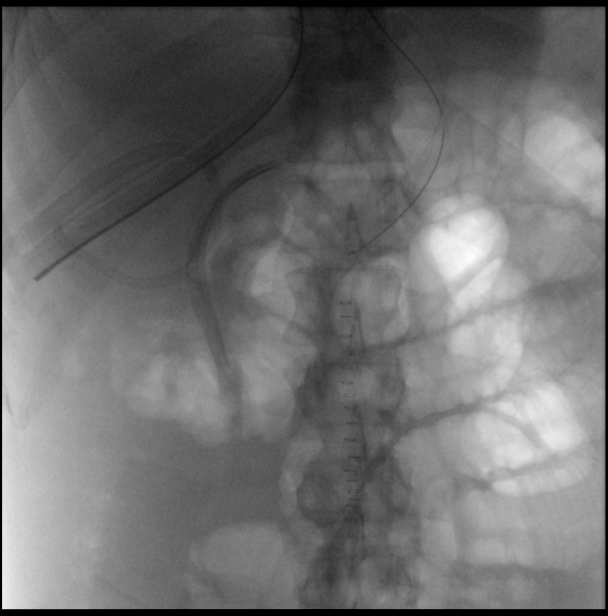

[Series 4: cp_standard · 0.26mm/px · 2 of 42 frames shown (2 of 7)]
[frame 20/42]
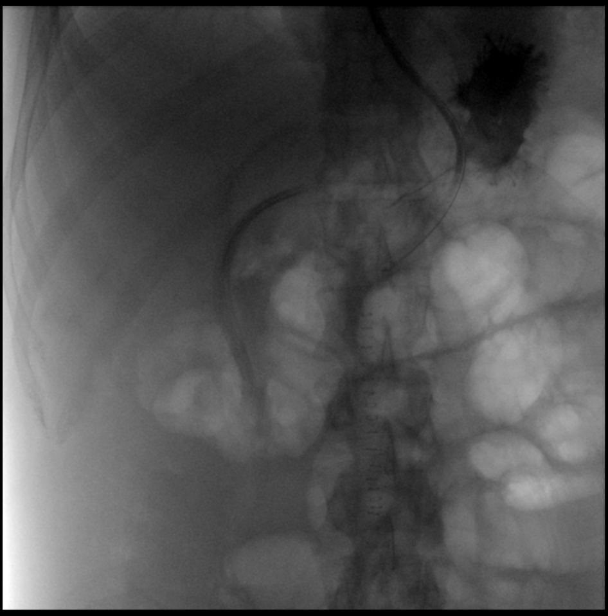
[frame 36/42]
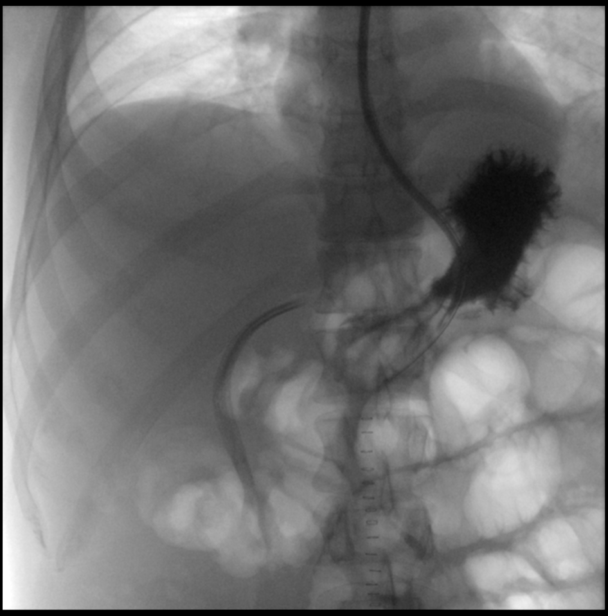

[Series 5: cp_standard · 0.26mm/px · 1 of 23 frames shown (3 of 7)]
[frame 10/23]
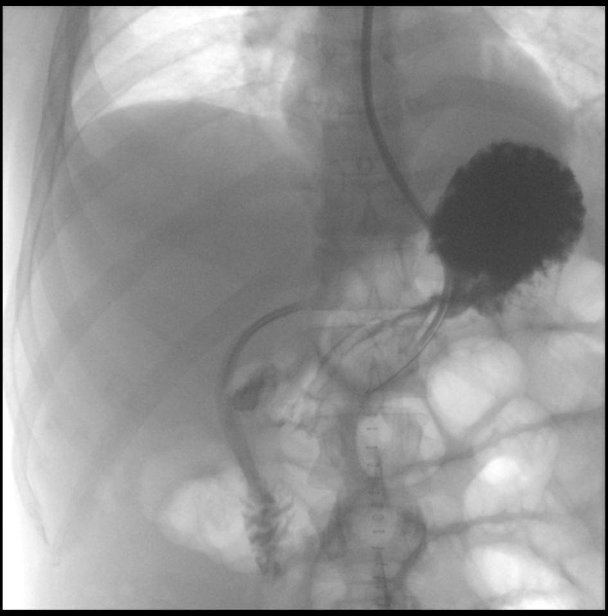

[Series 6: cp_standard · 0.26mm/px · 2 of 23 frames shown (4 of 7)]
[frame 4/23]
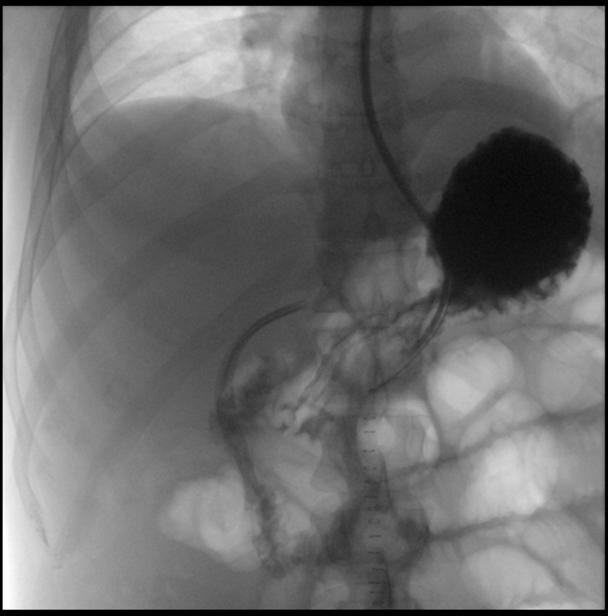
[frame 12/23]
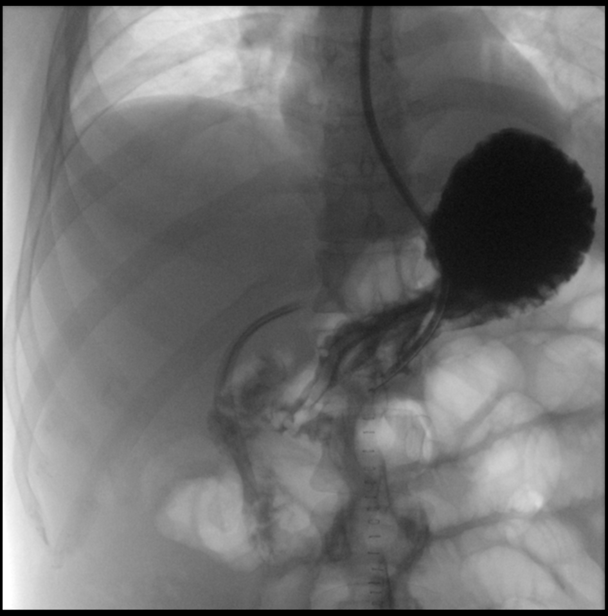

[Series 7: fluoro_barium singleshot_bw · 0.17mm/px · 1 of 1 slices shown (1 of 3)]
[im 1/1]
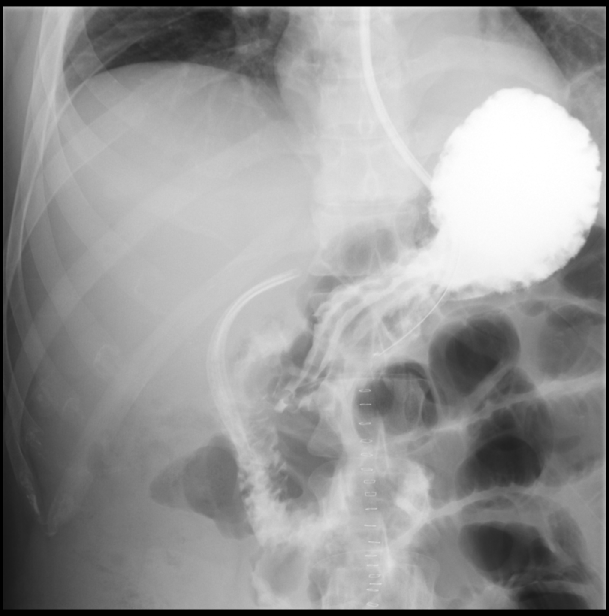

[Series 8: cp_standard · 0.26mm/px · 1 of 17 frames shown (5 of 7)]
[frame 3/17]
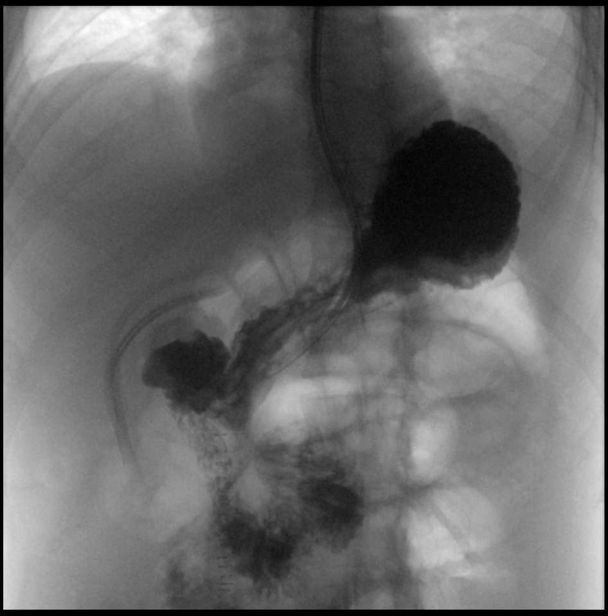

[Series 9: fluoro_barium singleshot_bw · 0.17mm/px · 1 of 1 slices shown (2 of 3)]
[im 1/1]
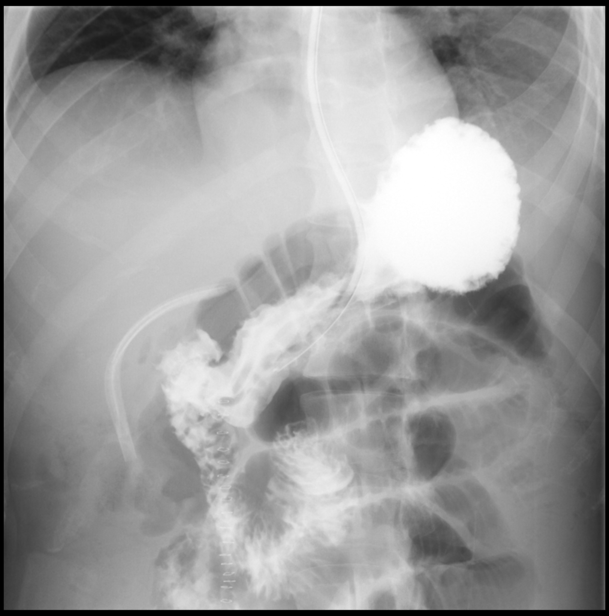

[Series 11: cp_standard · 0.26mm/px · 2 of 9 frames shown (6 of 7)]
[frame 5/9]
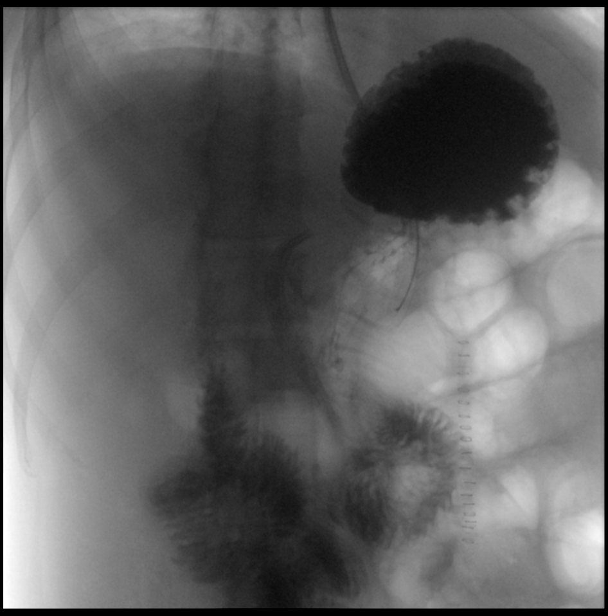
[frame 8/9]
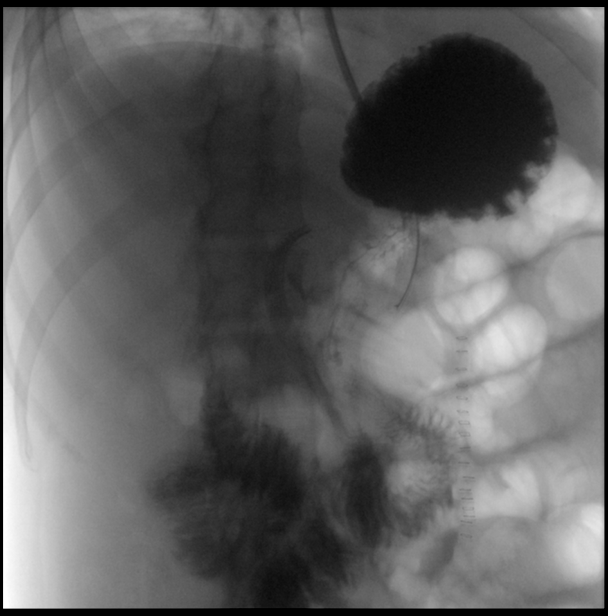

[Series 12: cp_standard · 0.25mm/px · 1 of 23 frames shown (7 of 7)]
[frame 4/23]
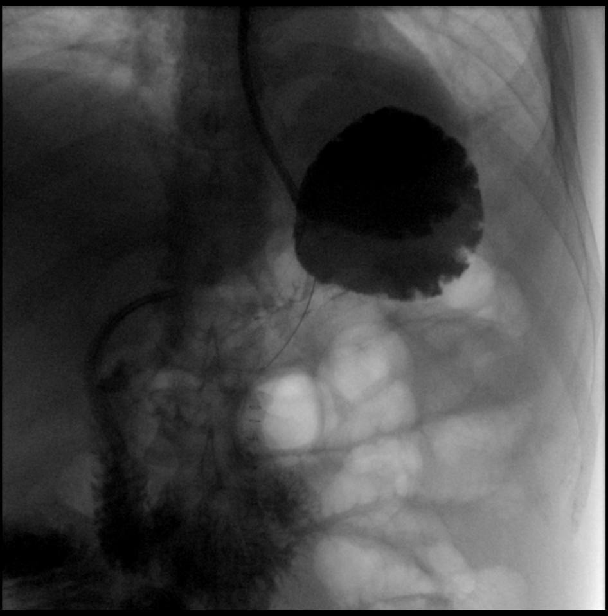

[Series 13: fluoro_barium singleshot_bw · 0.17mm/px · 1 of 1 slices shown (3 of 3)]
[im 1/1]
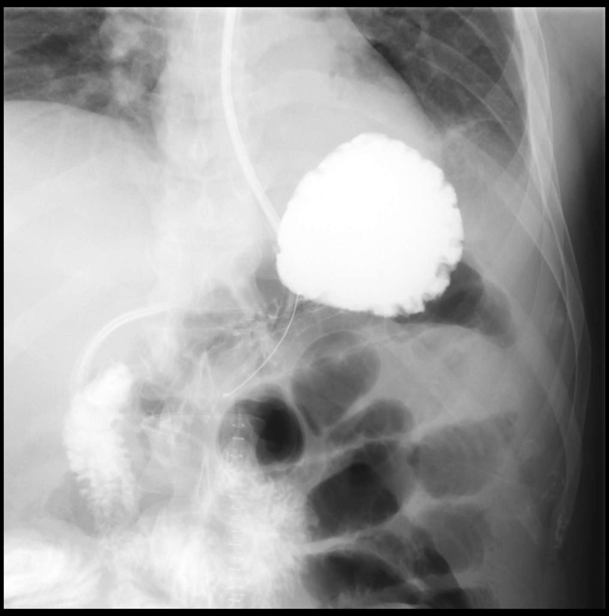

[14 of 24 positions shown; findings below may reference images not displayed]

FINDINGS: Initial KUB demonstrates mildly dilated loops of small bowel in the
left upper quadrant, a nasogastric tube with tip in the stomach
body, and a right upper quadrant drain. Skin clips noted in the
midline.

Omnipaque 300 contrast medium was slowly instilled through the
nasogastric tube with careful observation of the stomach during
installation, with the patient in the supine position. Subsequently,
we gently turn the patient into the LPO and RPO positions as
tolerated by the patient's pain/discomfort and obtained fluoroscopic
and spot film imaging in these projections.

Contrast medium is noted to flow from the stomach into the duodenum.
There is a suggestion of some fold irregularity/fold thickening in
the gastric antrum for example on image 15 series 6, but no leakage
of contrast is identified. No contrast is noted to extend over
adjacent to the drain.

The nasogastric tube was flushed with water after the contrast
injections in order to clear the tube of contrast.
IMPRESSION: 1. No current findings of leak from the stomach in the region of the
plication. Mild fold thickening in the distal stomach is likely a
manifestation of the patient's peptic ulcer disease.
2. Left upper quadrant loops of mildly dilated small bowel, probably
from ileus but technically nonspecific.
